# Patient Record
Sex: Female | Born: 1955
Health system: Southern US, Community
[De-identification: ages and names within clinical notes are randomized; demographics above are authoritative.]

## PROBLEM LIST (undated history)

## (undated) DIAGNOSIS — G4700 Insomnia, unspecified: Secondary | ICD-10-CM

## (undated) DIAGNOSIS — R011 Cardiac murmur, unspecified: Secondary | ICD-10-CM

## (undated) HISTORY — DX: Cardiac murmur, unspecified: R01.1

## (undated) HISTORY — DX: Insomnia, unspecified: G47.00

## (undated) HISTORY — PX: POLYPECTOMY: SHX149

---

## 1979-10-09 HISTORY — PX: OVARIAN CYST REMOVAL: SHX89

## 2006-08-07 ENCOUNTER — Ambulatory Visit: Payer: Self-pay | Admitting: Family Medicine

## 2007-05-12 ENCOUNTER — Ambulatory Visit: Payer: Self-pay | Admitting: Family Medicine

## 2007-05-12 DIAGNOSIS — M545 Low back pain, unspecified: Secondary | ICD-10-CM | POA: Insufficient documentation

## 2007-05-12 DIAGNOSIS — G4700 Insomnia, unspecified: Secondary | ICD-10-CM

## 2007-05-12 DIAGNOSIS — M899 Disorder of bone, unspecified: Secondary | ICD-10-CM

## 2007-05-12 DIAGNOSIS — M949 Disorder of cartilage, unspecified: Secondary | ICD-10-CM

## 2007-08-13 ENCOUNTER — Telehealth: Payer: Self-pay | Admitting: Family Medicine

## 2007-09-30 ENCOUNTER — Ambulatory Visit: Payer: Self-pay | Admitting: Family Medicine

## 2007-10-06 ENCOUNTER — Ambulatory Visit: Payer: Self-pay | Admitting: Family Medicine

## 2007-10-07 LAB — CONVERTED CEMR LAB
ALT: 21 units/L (ref 0–35)
Alkaline Phosphatase: 85 units/L (ref 39–117)
BUN: 14 mg/dL (ref 6–23)
Basophils Relative: 3.4 % — ABNORMAL HIGH (ref 0.0–1.0)
Bilirubin, Direct: 0.2 mg/dL (ref 0.0–0.3)
CA 125: 6.3 units/mL (ref 0.0–30.2)
CO2: 31 meq/L (ref 19–32)
Creatinine, Ser: 0.6 mg/dL (ref 0.4–1.2)
Eosinophils Relative: 2.2 % (ref 0.0–5.0)
Glucose, Bld: 82 mg/dL (ref 70–99)
HCT: 43.5 % (ref 36.0–46.0)
Hemoglobin: 14.8 g/dL (ref 12.0–15.0)
LDL Cholesterol: 105 mg/dL — ABNORMAL HIGH (ref 0–99)
Lymphocytes Relative: 27.9 % (ref 12.0–46.0)
Monocytes Absolute: 0.3 10*3/uL (ref 0.2–0.7)
Monocytes Relative: 5.7 % (ref 3.0–11.0)
Potassium: 3.9 meq/L (ref 3.5–5.1)
RDW: 11.3 % — ABNORMAL LOW (ref 11.5–14.6)
Total Bilirubin: 0.7 mg/dL (ref 0.3–1.2)
Total Protein: 7.3 g/dL (ref 6.0–8.3)
VLDL: 17 mg/dL (ref 0–40)
WBC: 5.4 10*3/uL (ref 4.5–10.5)

## 2007-11-27 ENCOUNTER — Ambulatory Visit: Payer: Self-pay | Admitting: Family Medicine

## 2007-12-01 LAB — CONVERTED CEMR LAB: Calcium: 9.7 mg/dL (ref 8.4–10.5)

## 2007-12-03 ENCOUNTER — Ambulatory Visit: Payer: Self-pay | Admitting: Family Medicine

## 2008-04-21 ENCOUNTER — Telehealth: Payer: Self-pay | Admitting: Family Medicine

## 2008-10-18 ENCOUNTER — Telehealth: Payer: Self-pay | Admitting: Family Medicine

## 2008-12-01 ENCOUNTER — Ambulatory Visit: Payer: Self-pay | Admitting: Family Medicine

## 2008-12-03 ENCOUNTER — Encounter: Payer: Self-pay | Admitting: Family Medicine

## 2008-12-03 LAB — CONVERTED CEMR LAB
ALT: 18 units/L (ref 0–35)
Alkaline Phosphatase: 73 units/L (ref 39–117)
Bilirubin Urine: NEGATIVE
Bilirubin, Direct: 0.2 mg/dL (ref 0.0–0.3)
CO2: 30 meq/L (ref 19–32)
GFR calc Af Amer: 112 mL/min
Glucose, Bld: 87 mg/dL (ref 70–99)
HDL: 54 mg/dL (ref >39.0)
Hemoglobin, Urine: NEGATIVE
Leukocytes, UA: NEGATIVE
Lymphocytes Relative: 33.4 % (ref 12.0–46.0)
Monocytes Relative: 10 % (ref 3.0–12.0)
Neutrophils Relative %: 53.8 % (ref 43.0–77.0)
Nitrite: NEGATIVE
Platelets: 174 10*3/uL (ref 150–400)
Potassium: 4 meq/L (ref 3.5–5.1)
RDW: 11.2 % — ABNORMAL LOW (ref 11.5–14.6)
Sodium: 142 meq/L (ref 135–145)
Total CHOL/HDL Ratio: 3.1
Total Protein: 6.7 g/dL (ref 6.0–8.3)
VLDL: 11 mg/dL (ref 0–40)
Vit D, 25-Hydroxy: 37 ng/mL (ref 30–89)

## 2008-12-09 ENCOUNTER — Ambulatory Visit: Payer: Self-pay | Admitting: Family Medicine

## 2008-12-09 DIAGNOSIS — E559 Vitamin D deficiency, unspecified: Secondary | ICD-10-CM | POA: Insufficient documentation

## 2009-04-08 ENCOUNTER — Telehealth: Payer: Self-pay | Admitting: Family Medicine

## 2009-05-02 ENCOUNTER — Ambulatory Visit: Payer: Self-pay | Admitting: Internal Medicine

## 2009-05-16 ENCOUNTER — Ambulatory Visit: Payer: Self-pay | Admitting: Internal Medicine

## 2009-05-16 ENCOUNTER — Encounter: Payer: Self-pay | Admitting: Internal Medicine

## 2009-05-17 ENCOUNTER — Encounter: Payer: Self-pay | Admitting: Internal Medicine

## 2009-05-26 ENCOUNTER — Ambulatory Visit: Payer: Self-pay | Admitting: Family Medicine

## 2009-07-26 ENCOUNTER — Ambulatory Visit: Payer: Self-pay | Admitting: Family Medicine

## 2009-07-26 DIAGNOSIS — D179 Benign lipomatous neoplasm, unspecified: Secondary | ICD-10-CM | POA: Insufficient documentation

## 2009-09-19 ENCOUNTER — Ambulatory Visit: Payer: Self-pay | Admitting: Family Medicine

## 2009-09-19 DIAGNOSIS — J0110 Acute frontal sinusitis, unspecified: Secondary | ICD-10-CM | POA: Insufficient documentation

## 2010-03-23 ENCOUNTER — Telehealth (INDEPENDENT_AMBULATORY_CARE_PROVIDER_SITE_OTHER): Payer: Self-pay

## 2010-05-04 ENCOUNTER — Ambulatory Visit: Payer: Self-pay | Admitting: Family Medicine

## 2010-05-04 LAB — CONVERTED CEMR LAB
Bilirubin Urine: NEGATIVE
Ketones, urine, test strip: NEGATIVE
Specific Gravity, Urine: 1.015
Urobilinogen, UA: 0.2

## 2010-05-10 LAB — CONVERTED CEMR LAB
BUN: 19 mg/dL (ref 6–23)
Bilirubin, Direct: 0.1 mg/dL (ref 0.0–0.3)
CO2: 29 meq/L (ref 19–32)
Chloride: 99 meq/L (ref 96–112)
Cholesterol: 164 mg/dL (ref 0–200)
Creatinine, Ser: 0.6 mg/dL (ref 0.4–1.2)
Eosinophils Absolute: 0.1 10*3/uL (ref 0.0–0.7)
Glucose, Bld: 82 mg/dL (ref 70–99)
LDL Cholesterol: 91 mg/dL (ref 0–99)
MCHC: 34.3 g/dL (ref 30.0–36.0)
MCV: 87.3 fL (ref 78.0–100.0)
Monocytes Absolute: 0.5 10*3/uL (ref 0.1–1.0)
Neutrophils Relative %: 60.8 % (ref 43.0–77.0)
Platelets: 197 10*3/uL (ref 150.0–400.0)
RDW: 12.4 % (ref 11.5–14.6)
TSH: 1.77 microintl units/mL (ref 0.35–5.50)
Total Bilirubin: 0.6 mg/dL (ref 0.3–1.2)
Triglycerides: 120 mg/dL (ref 0.0–149.0)
Vit D, 25-Hydroxy: 79 ng/mL (ref 30–89)

## 2010-05-30 ENCOUNTER — Ambulatory Visit: Payer: Self-pay | Admitting: Family Medicine

## 2010-08-18 ENCOUNTER — Ambulatory Visit: Payer: Self-pay | Admitting: Family Medicine

## 2010-08-18 DIAGNOSIS — F329 Major depressive disorder, single episode, unspecified: Secondary | ICD-10-CM

## 2010-08-18 DIAGNOSIS — F3289 Other specified depressive episodes: Secondary | ICD-10-CM | POA: Insufficient documentation

## 2010-08-21 ENCOUNTER — Telehealth: Payer: Self-pay | Admitting: Family Medicine

## 2010-11-07 NOTE — Assessment & Plan Note (Signed)
Summary: fu on med/njr   Vital Signs:  Patient profile:   55 year old female Weight:      132 pounds O2 Sat:      99 % Temp:     98.7 degrees F Pulse rate:   69 / minute BP sitting:   120 / 80  (left arm)  Vitals Entered By: Pura Spice, RN (August 18, 2010 9:54 AM) CC: discuss meds    History of Present Illness: Here to discuss the stress she has been going through for the past 6 months. She and her husband have been dealing with a lot of marital problems, and she thinks the marriage may not survive. They are both seeing separate therapists, but are not making much progress.  In addition to this, she is working full time and going to graduate school. She is always tense, fearful, tearful, and loses her  patience easily. She sleeps well thanks to the Temazepam she is taking. Trying to eat well and exercise.   Allergies (verified): No Known Drug Allergies  Past History:  Past Medical History: Reviewed history from 05/30/2010 and no changes required. osteoporosis (last DEXA on 2010) Insomnia heart murmur as a child, resolved sees Dr. Esperanza Richters for GYN exams  Review of Systems  The patient denies anorexia, fever, weight loss, weight gain, vision loss, decreased hearing, hoarseness, chest pain, syncope, dyspnea on exertion, peripheral edema, prolonged cough, headaches, hemoptysis, abdominal pain, melena, hematochezia, severe indigestion/heartburn, hematuria, incontinence, genital sores, muscle weakness, suspicious skin lesions, transient blindness, difficulty walking, depression, unusual weight change, abnormal bleeding, enlarged lymph nodes, angioedema, breast masses, and testicular masses.    Physical Exam  General:  Well-developed,well-nourished,in no acute distress; alert,appropriate and cooperative throughout examination Psych:  Oriented X3, memory intact for recent and remote, normally interactive, good eye contact, tearful, and slightly anxious.     Impression &  Recommendations:  Problem # 1:  DEPRESSION (ICD-311)  Her updated medication list for this problem includes:    Celexa 20 Mg Tabs (Citalopram hydrobromide) ..... Once daily  Complete Medication List: 1)  Restoril 15 Mg Caps (Temazepam) .... At bedtime as needed sleep 2)  Celexa 20 Mg Tabs (Citalopram hydrobromide) .... Once daily 3)  Evista 60 Mg Tabs (Raloxifene hcl) .... Once daily  Patient Instructions: 1)  We spent 30 minutes discussing this and its treatment. Try Celexa.  2)  Please schedule a follow-up appointment in 1 month.  Prescriptions: CELEXA 20 MG TABS (CITALOPRAM HYDROBROMIDE) once daily  #30 x 11   Entered and Authorized by:   Nelwyn Salisbury MD   Signed by:   Nelwyn Salisbury MD on 08/18/2010   Method used:   [facility]  [place] [address]       [address]       [place], [place]  [address]       Ph: [phone] or [phone]       Fax: [phone]   RxID:   [id number]    Orders Added: 1)  Est. Patient Level IV [03474]

## 2010-11-07 NOTE — Progress Notes (Signed)
Summary: Pt req diff anti-anxiety med. Pls call in to [facility]  Phone Note Call from Patient Call back at [phone] work or [phone] cell   Caller: Patient Summary of Call: Pt called and said that the anti-anxiety med in not working. Pt having nausea and drowsiness. Pt [facility] [place]  Initial call taken by: Lucy Antigua,  August 21, 2010 11:21 AM  Follow-up for Phone Call        okay, stop this. try Xanax 0.25 mg two times a day as needed . Call in #60 with no rf. If this is too strong, she could take 1/2 tab two times a day . Follow-up by: Nelwyn Salisbury MD,  August 21, 2010 1:44 PM  Additional Follow-up for Phone Call Additional follow up Details #1::        mess left to return call  work number numerous busy signals Additional Follow-up by: Pura Spice, RN,  August 21, 2010 2:01 PM    Additional Follow-up for Phone Call Additional follow up Details #2::    spoke with pt med called in pt aware. [facility] spoke to Ray rx given  Follow-up by: Pura Spice, RN,  August 21, 2010 5:08 PM  New/Updated Medications: ALPRAZOLAM 0.25 MG TABS (ALPRAZOLAM) 1 by mouth two times a day as needed Prescriptions: ALPRAZOLAM 0.25 MG TABS (ALPRAZOLAM) 1 by mouth two times a day as needed  #60 x 0   Entered by:   Pura Spice, RN   Authorized by:   Nelwyn Salisbury MD   Signed by:   Pura Spice, RN on 08/21/2010   Method used:   Telephoned to ...       [facility]  [place] [address] (retail)       [address]       [place], [place]  [address]       Ph: [phone] or [phone]       Fax: [phone]   RxID:   [id number]

## 2010-11-07 NOTE — Progress Notes (Signed)
Summary: Pt req to have Vit D lab added to cpx labs  Phone Note Call from Patient Call back at Home Phone [phone]   Caller: Patient Summary of Call: Pt coming in for cpx in Aug 2011 and having cpx labs in July. Pt says she needs to have Vit D lvl checked during cpx labs.       Initial call taken by: Lucy Antigua,  March 23, 2010 10:20 AM  Follow-up for Phone Call        yes please do so Follow-up by: Nelwyn Salisbury MD,  March 23, 2010 10:28 AM

## 2010-11-07 NOTE — Assessment & Plan Note (Signed)
Summary: CPX/CJR---PT [facility] // RS   Vital Signs:  Patient profile:   55 year old female Weight:      133 pounds BMI:     23.27 BP sitting:   88 / 60  (left arm) Cuff size:   regular  Vitals Entered By: Raechel Ache, RN (May 30, 2010 2:55 PM) CC: CPX Comments , labs done. Sees gyn.   History of Present Illness: 55 yr old female for a cpx. She feels well and has no concerns.   Allergies (verified): No Known Drug Allergies  Past History:  Past Medical History: osteoporosis (last DEXA on 2010) Insomnia heart murmur as a child, resolved sees Dr. Esperanza Richters for GYN exams  Past Surgical History: Colonoscopy 05-16-09 per Dr. Marina Goodell, several benign polyps, repeat in 5 yrs ovarian cyst removal  Family History: Reviewed history from 05/12/2007 and no changes required. Family History Hypertension Fam hx Stroke  Social History: Reviewed history from 05/12/2007 and no changes required. Never Smoked Married Alcohol use-yes Drug use-no Regular exercise-yes  Review of Systems  The patient denies anorexia, fever, weight loss, weight gain, vision loss, decreased hearing, hoarseness, chest pain, syncope, dyspnea on exertion, peripheral edema, prolonged cough, headaches, hemoptysis, abdominal pain, melena, hematochezia, severe indigestion/heartburn, hematuria, incontinence, genital sores, muscle weakness, suspicious skin lesions, transient blindness, difficulty walking, depression, unusual weight change, abnormal bleeding, enlarged lymph nodes, angioedema, breast masses, and testicular masses.    Physical Exam  General:  Well-developed,well-nourished,in no acute distress; alert,appropriate and cooperative throughout examination Head:  Normocephalic and atraumatic without obvious abnormalities. No apparent alopecia or balding. Eyes:  No corneal or conjunctival inflammation noted. EOMI. Perrla. Funduscopic exam benign, without hemorrhages, exudates or papilledema. Vision grossly  normal. Ears:  External ear exam shows no significant lesions or deformities.  Otoscopic examination reveals clear canals, tympanic membranes are intact bilaterally without bulging, retraction, inflammation or discharge. Hearing is grossly normal bilaterally. Nose:  External nasal examination shows no deformity or inflammation. Nasal mucosa are pink and moist without lesions or exudates. Mouth:  Oral mucosa and oropharynx without lesions or exudates.  Teeth in good repair. Neck:  No deformities, masses, or tenderness noted. Chest Wall:  No deformities, masses, or tenderness noted. Lungs:  Normal respiratory effort, chest expands symmetrically. Lungs are clear to auscultation, no crackles or wheezes. Heart:  Normal rate and regular rhythm. S1 and S2 normal without gallop, murmur, click, rub or other extra sounds. EKG normal Abdomen:  Bowel sounds positive,abdomen soft and non-tender without masses, organomegaly or hernias noted. Msk:  No deformity or scoliosis noted of thoracic or lumbar spine.   Pulses:  R and L carotid,radial,femoral,dorsalis pedis and posterior tibial pulses are full and equal bilaterally Extremities:  No clubbing, cyanosis, edema, or deformity noted with normal full range of motion of all joints.   Neurologic:  No cranial nerve deficits noted. Station and gait are normal. Plantar reflexes are down-going bilaterally. DTRs are symmetrical throughout. Sensory, motor and coordinative functions appear intact. Skin:  Intact without suspicious lesions or rashes Cervical Nodes:  No lymphadenopathy noted Axillary Nodes:  No palpable lymphadenopathy Inguinal Nodes:  No significant adenopathy Psych:  Cognition and judgment appear intact. Alert and cooperative with normal attention span and concentration. No apparent delusions, illusions, hallucinations   Impression & Recommendations:  Problem # 1:  WELL ADULT EXAM (ICD-V70.0)  Orders: EKG w/ Interpretation (93000)  Complete  Medication List: 1)  Actonel 35 Mg Tabs (Risedronate sodium) .... One tablet every week 2)  Restoril 15  Mg Caps (Temazepam) .... At bedtime as needed sleep  Patient Instructions: 1)  Please schedule a follow-up appointment in 1 year.  Prescriptions: RESTORIL 15 MG  CAPS (TEMAZEPAM) at bedtime as needed sleep  #30 x 5   Entered and Authorized by:   Nelwyn Salisbury MD   Signed by:   Nelwyn Salisbury MD on 05/30/2010   Method used:   Print then Give to Patient   RxID:   [id number]

## 2011-02-23 NOTE — Assessment & Plan Note (Signed)
[facility] OFFICE NOTE   Latasha Marshall, Latasha Marshall                    MRN:          [id number]  DATE:08/07/2006                            DOB:          July 27, 1956    This is a 55 year old woman here to establish here with our practice.  She  is primarily to have her prescription for Actonel refilled, but also  requesting something to help with sleep.  She has tried some over-the-  counter agents.  They do not really work, and she feels hung over the next  morning with them.  She probably would use something for sleep 2 to 3 nights  per week only.  Of note, she was diagnosed with osteopenia about 2 years ago  by a DEXA scan.  She was started on Actonel as well as calcium and vitamin D  therapy at that time.  She then had a 2nd DEXA scan in July of this year,  which showed no substantial changes.  Of note, she moved back to [place]  from [place] in August of this year, having grown up in the [place]  area.   PAST MEDICAL HISTORY:  1. Includes a heart murmur as a child, which resolved.  2. She had a colonoscopy in 2006 when a couple of benign polyps were      removed.  3. She had chickenpox as a child.  4. She had surgery to remove a ovarian cyst in 1981.  5. She has had 2 vaginal deliveries.  6. Her last Pap smear was in 2006, and was normal.  7. She did have a normal mammogram in July of this year.   IMMUNIZATIONS:  She had a tetanus booster in June of this year.   ALLERGIES:  NONE.   CURRENT MEDICATIONS:  Actonel 35 mg once weekly.   HABITS:  She does drink some alcohol.  She does not use tobacco.   SOCIAL HISTORY:  She is married.  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   FAMILY HISTORY:  Remarkable for stroke and hypertension.   OBJECTIVE:  VITAL SIGNS:  Height 5 foot 3 inches.  Weight 131.  BP 114/77.  Pulse 66 and regular.  GENERAL:  She appears to be healthy.  Did  not do a detailed examination today.   ASSESSMENT AND PLAN:  1. Osteopenia.  I refilled Actonel for the coming year, and I encouraged      her to continue with      Calcium and vitamin D supplementation.  2. Insomnia.  We will try temazepam 15 mg q. h.s. as needed, number 30      with 5 refills.    ______________________________  Tera Mater Clent Ridges, MD    SAF/MedQ  DD: 08/08/2006  DT: 08/08/2006  Job #: [id number]

## 2011-05-21 ENCOUNTER — Ambulatory Visit: Payer: Self-pay

## 2011-05-21 DIAGNOSIS — Z0000 Encounter for general adult medical examination without abnormal findings: Secondary | ICD-10-CM

## 2011-05-21 LAB — CBC WITH DIFFERENTIAL/PLATELET
Basophils Relative: 0.5 % (ref 0.0–3.0)
Eosinophils Absolute: 0.1 10*3/uL (ref 0.0–0.7)
Eosinophils Relative: 1.7 % (ref 0.0–5.0)
Hemoglobin: 14.1 g/dL (ref 12.0–15.0)
Lymphocytes Relative: 29.6 % (ref 12.0–46.0)
MCHC: 33.6 g/dL (ref 30.0–36.0)
MCV: 88.5 fl (ref 78.0–100.0)
Monocytes Absolute: 0.4 10*3/uL (ref 0.1–1.0)
Neutro Abs: 2.9 10*3/uL (ref 1.4–7.7)
RBC: 4.74 Mil/uL (ref 3.87–5.11)

## 2011-05-21 LAB — HEPATIC FUNCTION PANEL
Alkaline Phosphatase: 71 U/L (ref 39–117)
Bilirubin, Direct: 0.1 mg/dL (ref 0.0–0.3)
Total Bilirubin: 1 mg/dL (ref 0.3–1.2)
Total Protein: 7 g/dL (ref 6.0–8.3)

## 2011-05-21 LAB — URINALYSIS, ROUTINE W REFLEX MICROSCOPIC
Hgb urine dipstick: NEGATIVE
Nitrite: NEGATIVE
Specific Gravity, Urine: 1.01 (ref 1.000–1.030)
Total Protein, Urine: NEGATIVE
Urine Glucose: NEGATIVE
pH: 7.5 (ref 5.0–8.0)

## 2011-05-21 LAB — BASIC METABOLIC PANEL
BUN: 15 mg/dL (ref 6–23)
Chloride: 101 mEq/L (ref 96–112)
Creatinine, Ser: 0.8 mg/dL (ref 0.4–1.2)
Glucose, Bld: 88 mg/dL (ref 70–99)

## 2011-05-21 LAB — LIPID PANEL
Cholesterol: 170 mg/dL (ref 0–200)
LDL Cholesterol: 85 mg/dL (ref 0–99)

## 2011-05-24 ENCOUNTER — Telehealth: Payer: Self-pay | Admitting: Family Medicine

## 2011-05-24 ENCOUNTER — Other Ambulatory Visit: Payer: Self-pay

## 2011-05-24 NOTE — Telephone Encounter (Signed)
Left voice message.

## 2011-05-24 NOTE — Telephone Encounter (Signed)
Message copied by Baldemar Friday on Thu May 24, 2011  1:08 PM ------      Message from: Gershon Crane A      Created: Thu May 24, 2011  5:52 AM       normal

## 2011-05-30 ENCOUNTER — Encounter: Payer: Self-pay | Admitting: Family Medicine

## 2011-05-31 ENCOUNTER — Encounter: Payer: Self-pay | Admitting: Family Medicine

## 2011-05-31 ENCOUNTER — Ambulatory Visit (INDEPENDENT_AMBULATORY_CARE_PROVIDER_SITE_OTHER): Payer: BC Managed Care – PPO | Admitting: Family Medicine

## 2011-05-31 VITALS — BP 106/64 | HR 60 | Temp 98.6°F | Ht 63.5 in | Wt 134.0 lb

## 2011-05-31 DIAGNOSIS — Z2911 Encounter for prophylactic immunotherapy for respiratory syncytial virus (RSV): Secondary | ICD-10-CM

## 2011-05-31 DIAGNOSIS — M810 Age-related osteoporosis without current pathological fracture: Secondary | ICD-10-CM

## 2011-05-31 DIAGNOSIS — Z0000 Encounter for general adult medical examination without abnormal findings: Secondary | ICD-10-CM

## 2011-05-31 MED ORDER — TEMAZEPAM 15 MG PO CAPS: 15.0000 mg | ORAL_CAPSULE | Freq: Every evening | ORAL | Status: DC | PRN

## 2011-05-31 NOTE — Progress Notes (Signed)
  Subjective:    Patient ID: Latasha Marshall, female    DOB: [birth year], 55 y.o.   MRN: [id number]  HPI 55 yr old female for a cpx. She feels well and has no complaints. She would like a shingles shot. She works out regularly at [employer]    Review of Systems  Constitutional: Negative.   HENT: Negative.   Eyes: Negative.   Respiratory: Negative.   Cardiovascular: Negative.   Gastrointestinal: Negative.   Genitourinary: Negative for dysuria, urgency, frequency, hematuria, flank pain, decreased urine volume, enuresis, difficulty urinating, pelvic pain and dyspareunia.  Musculoskeletal: Negative.   Skin: Negative.   Neurological: Negative.   Hematological: Negative.   Psychiatric/Behavioral: Negative.        Objective:   Physical Exam  Constitutional: She is oriented to person, place, and time. She appears well-developed and well-nourished. No distress.  HENT:  Head: Normocephalic and atraumatic.  Right Ear: External ear normal.  Left Ear: External ear normal.  Nose: Nose normal.  Mouth/Throat: Oropharynx is clear and moist. No oropharyngeal exudate.  Eyes: Conjunctivae and EOM are normal. Pupils are equal, round, and reactive to light. No scleral icterus.  Neck: Normal range of motion. Neck supple. No JVD present. No thyromegaly present.  Cardiovascular: Normal rate, regular rhythm, normal heart sounds and intact distal pulses.  Exam reveals no gallop and no friction rub.   No murmur heard.      EKG normal   Pulmonary/Chest: Effort normal and breath sounds normal. No respiratory distress. She has no wheezes. She has no rales. She exhibits no tenderness.  Abdominal: Soft. Bowel sounds are normal. She exhibits no distension and no mass. There is no tenderness. There is no rebound and no guarding.  Musculoskeletal: Normal range of motion. She exhibits no edema and no tenderness.  Lymphadenopathy:    She has no cervical adenopathy.  Neurological: She is alert and oriented to  person, place, and time. She has normal reflexes. No cranial nerve deficit. She exhibits normal muscle tone. Coordination normal.  Skin: Skin is warm and dry. No rash noted. No erythema.  Psychiatric: She has a normal mood and affect. Her behavior is normal. Judgment and thought content normal.          Assessment & Plan:  Well exam. Check a vitamin D level.

## 2011-06-06 ENCOUNTER — Telehealth: Payer: Self-pay | Admitting: Family Medicine

## 2011-06-06 NOTE — Telephone Encounter (Signed)
Left voice message with results.

## 2011-06-06 NOTE — Telephone Encounter (Signed)
Message copied by Baldemar Friday on Wed Jun 06, 2011 10:51 AM ------      Message from: Gershon Crane A      Created: Mon Jun 04, 2011 10:28 AM       normal

## 2012-03-05 ENCOUNTER — Telehealth: Payer: Self-pay | Admitting: Family Medicine

## 2012-03-05 NOTE — Telephone Encounter (Signed)
Call in #30 with 5 rf 

## 2012-03-05 NOTE — Telephone Encounter (Signed)
Refill request for Temazepam 15 mg take 1 po qhs prn and last here on 05/31/11.

## 2012-03-07 MED ORDER — TEMAZEPAM 15 MG PO CAPS: 15.0000 mg | ORAL_CAPSULE | Freq: Every evening | ORAL | Status: DC | PRN

## 2012-03-07 NOTE — Telephone Encounter (Signed)
I called in script 

## 2012-08-11 ENCOUNTER — Telehealth: Payer: Self-pay | Admitting: Family Medicine

## 2012-08-11 DIAGNOSIS — Z0000 Encounter for general adult medical examination without abnormal findings: Secondary | ICD-10-CM

## 2012-08-11 NOTE — Telephone Encounter (Signed)
done

## 2012-08-11 NOTE — Telephone Encounter (Signed)
Patient called to schedule cpx and labs and is requesting to have a Vit D added to labs for 08/18/12. Please assist.

## 2012-08-18 ENCOUNTER — Other Ambulatory Visit (INDEPENDENT_AMBULATORY_CARE_PROVIDER_SITE_OTHER): Payer: BC Managed Care – PPO

## 2012-08-18 DIAGNOSIS — Z0000 Encounter for general adult medical examination without abnormal findings: Secondary | ICD-10-CM

## 2012-08-18 LAB — CBC WITH DIFFERENTIAL/PLATELET
Basophils Relative: 0.3 % (ref 0.0–3.0)
Eosinophils Absolute: 0 10*3/uL (ref 0.0–0.7)
HCT: 41.6 % (ref 36.0–46.0)
Lymphs Abs: 1.4 10*3/uL (ref 0.7–4.0)
MCHC: 33 g/dL (ref 30.0–36.0)
MCV: 88.8 fl (ref 78.0–100.0)
Monocytes Absolute: 0.4 10*3/uL (ref 0.1–1.0)
Neutrophils Relative %: 70 % (ref 43.0–77.0)
RBC: 4.69 Mil/uL (ref 3.87–5.11)

## 2012-08-18 LAB — BASIC METABOLIC PANEL
BUN: 12 mg/dL (ref 6–23)
CO2: 26 mEq/L (ref 19–32)
Chloride: 104 mEq/L (ref 96–112)
Creatinine, Ser: 0.7 mg/dL (ref 0.4–1.2)
Potassium: 4 mEq/L (ref 3.5–5.1)

## 2012-08-18 LAB — HEPATIC FUNCTION PANEL
ALT: 20 U/L (ref 0–35)
Albumin: 3.9 g/dL (ref 3.5–5.2)
Bilirubin, Direct: 0.1 mg/dL (ref 0.0–0.3)
Total Protein: 7 g/dL (ref 6.0–8.3)

## 2012-08-18 LAB — POCT URINALYSIS DIPSTICK
Ketones, UA: NEGATIVE
Leukocytes, UA: NEGATIVE
Nitrite, UA: NEGATIVE
Protein, UA: NEGATIVE
Urobilinogen, UA: 0.2
pH, UA: 7.5

## 2012-08-18 LAB — TSH: TSH: 1.82 u[IU]/mL (ref 0.35–5.50)

## 2012-08-18 LAB — LIPID PANEL: Cholesterol: 160 mg/dL (ref 0–200)

## 2012-08-19 LAB — VITAMIN D 25 HYDROXY (VIT D DEFICIENCY, FRACTURES): Vit D, 25-Hydroxy: 52 ng/mL (ref 30–89)

## 2012-08-20 NOTE — Progress Notes (Signed)
Quick Note:  I left voice message with results. ______ 

## 2012-08-29 ENCOUNTER — Encounter: Payer: Self-pay | Admitting: Family Medicine

## 2012-08-29 ENCOUNTER — Ambulatory Visit (INDEPENDENT_AMBULATORY_CARE_PROVIDER_SITE_OTHER): Payer: BC Managed Care – PPO | Admitting: Family Medicine

## 2012-08-29 VITALS — BP 110/68 | HR 72 | Temp 98.6°F | Ht 63.5 in | Wt 135.0 lb

## 2012-08-29 DIAGNOSIS — Z0000 Encounter for general adult medical examination without abnormal findings: Secondary | ICD-10-CM

## 2012-08-29 MED ORDER — TEMAZEPAM 15 MG PO CAPS: 15.0000 mg | ORAL_CAPSULE | Freq: Every evening | ORAL | Status: DC | PRN

## 2012-08-29 NOTE — Progress Notes (Signed)
  Subjective:    Patient ID: Latasha Marshall, female    DOB: September 01, 1956, 56 y.o.   MRN: [id number]  HPI 56 yr old female for a cpx. She feels great and has no complaints except for several weeks of mild pain in the left shoulder. No recent trauma. She works out with [employer] daily at home. She is going through a divorce which will be finalized next February.    Review of Systems  Constitutional: Negative.   HENT: Negative.   Eyes: Negative.   Respiratory: Negative.   Cardiovascular: Negative.   Gastrointestinal: Negative.   Genitourinary: Negative for dysuria, urgency, frequency, hematuria, flank pain, decreased urine volume, enuresis, difficulty urinating, pelvic pain and dyspareunia.  Musculoskeletal: Negative.   Skin: Negative.   Neurological: Negative.   Hematological: Negative.   Psychiatric/Behavioral: Negative.        Objective:   Physical Exam  Constitutional: She is oriented to person, place, and time. She appears well-developed and well-nourished. No distress.  HENT:  Head: Normocephalic and atraumatic.  Right Ear: External ear normal.  Left Ear: External ear normal.  Nose: Nose normal.  Mouth/Throat: Oropharynx is clear and moist. No oropharyngeal exudate.  Eyes: Conjunctivae normal and EOM are normal. Pupils are equal, round, and reactive to light. No scleral icterus.  Neck: Normal range of motion. Neck supple. No JVD present. No thyromegaly present.  Cardiovascular: Normal rate, regular rhythm, normal heart sounds and intact distal pulses.  Exam reveals no gallop and no friction rub.   No murmur heard.      EKG normal   Pulmonary/Chest: Effort normal and breath sounds normal. No respiratory distress. She has no wheezes. She has no rales. She exhibits no tenderness.  Abdominal: Soft. Bowel sounds are normal. She exhibits no distension and no mass. There is no tenderness. There is no rebound and no guarding.  Musculoskeletal: Normal range of motion. She  exhibits no edema and no tenderness.  Lymphadenopathy:    She has no cervical adenopathy.  Neurological: She is alert and oriented to person, place, and time. She has normal reflexes. No cranial nerve deficit. She exhibits normal muscle tone. Coordination normal.  Skin: Skin is warm and dry. No rash noted. No erythema.  Psychiatric: She has a normal mood and affect. Her behavior is normal. Judgment and thought content normal.          Assessment & Plan:  Well exam. I suggested she stop lifting weights for a few weeks to rest the shoulder.

## 2012-12-15 ENCOUNTER — Other Ambulatory Visit: Payer: Self-pay | Admitting: Obstetrics and Gynecology

## 2012-12-23 ENCOUNTER — Ambulatory Visit
Admission: RE | Admit: 2012-12-23 | Discharge: 2012-12-23 | Disposition: A | Discharge status: Home / Self Care | Payer: BC Managed Care – PPO | Source: Ambulatory Visit | Attending: Obstetrics and Gynecology | Admitting: Obstetrics and Gynecology

## 2012-12-23 DIAGNOSIS — R928 Other abnormal and inconclusive findings on diagnostic imaging of breast: Secondary | ICD-10-CM

## 2012-12-23 IMAGING — MG MM DIGITAL DIAGNOSTIC UNILAT*L*
2 series · 2 of 2 positions shown · non-contrast
Comparison: [DATE] [DATE], [DATE], [DATE] [DATE], [DATE], [DATE] [DATE], [DATE]

CLINICAL DATA: Called back from screening mammogram for possible
mass left breast

DIGITAL DIAGNOSTIC LEFT MAMMOGRAM [DATE] AND LEFT BREAST
ULTRASOUND:

[L CC]
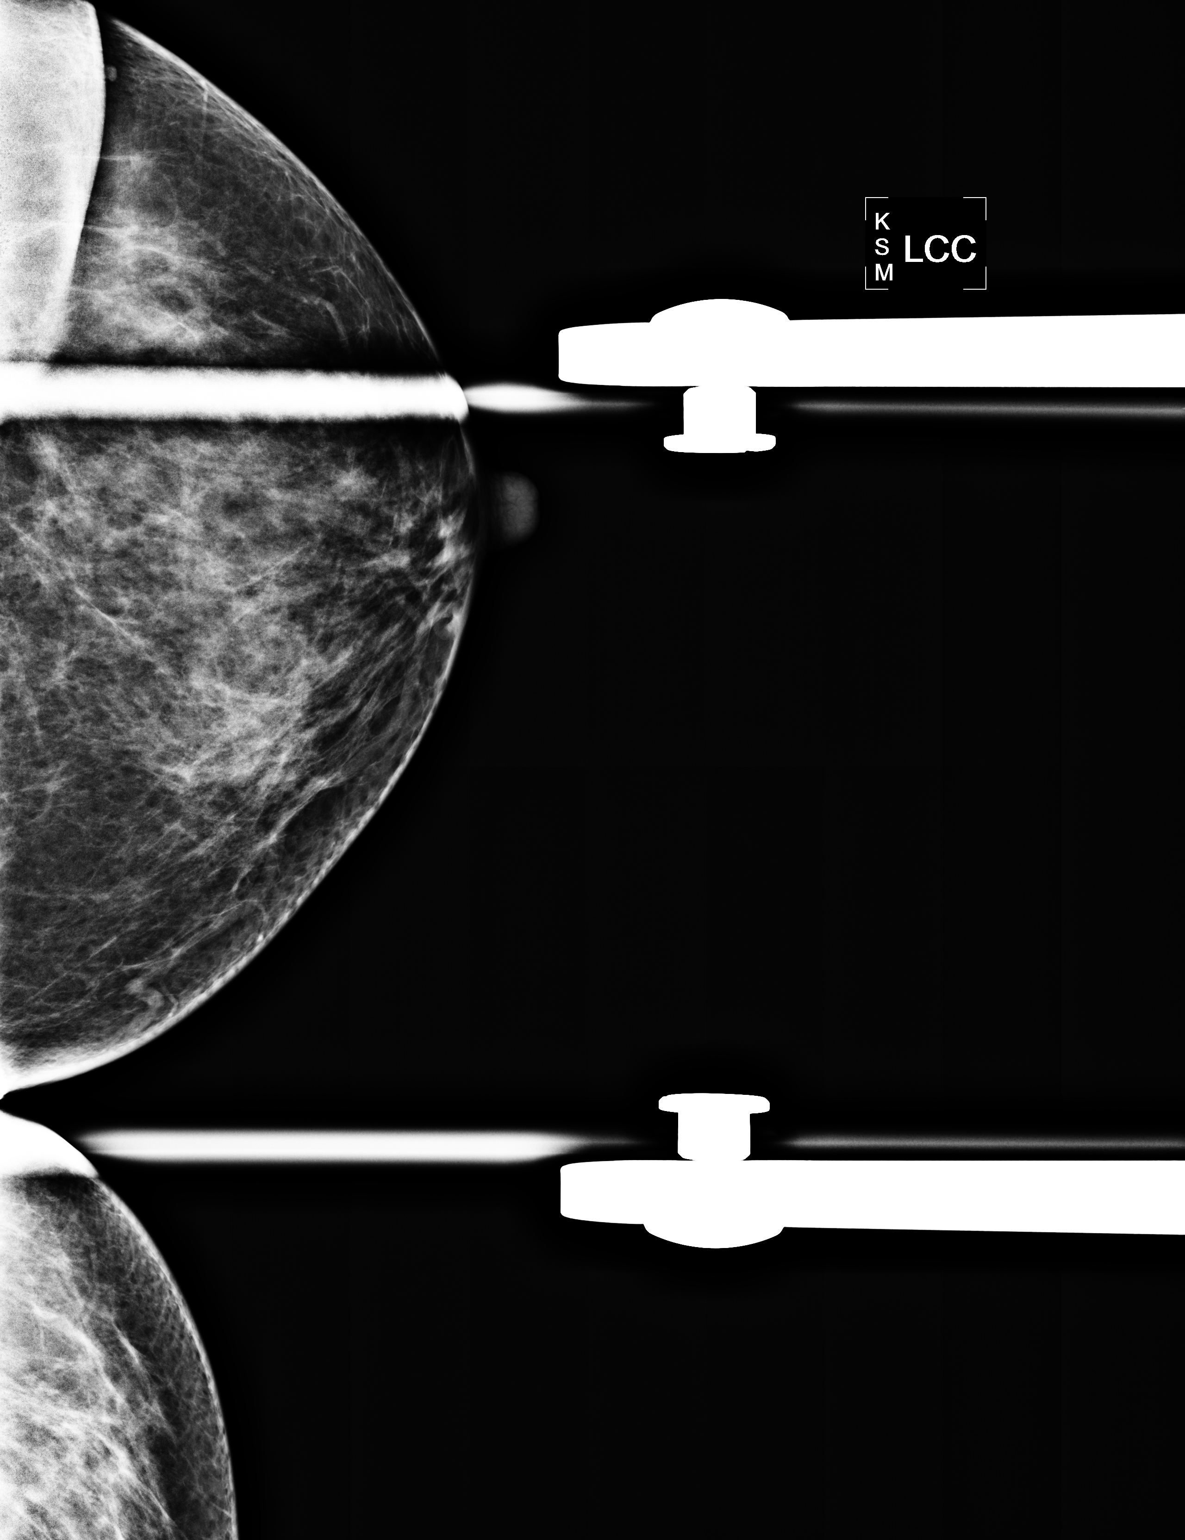

[L MLO]
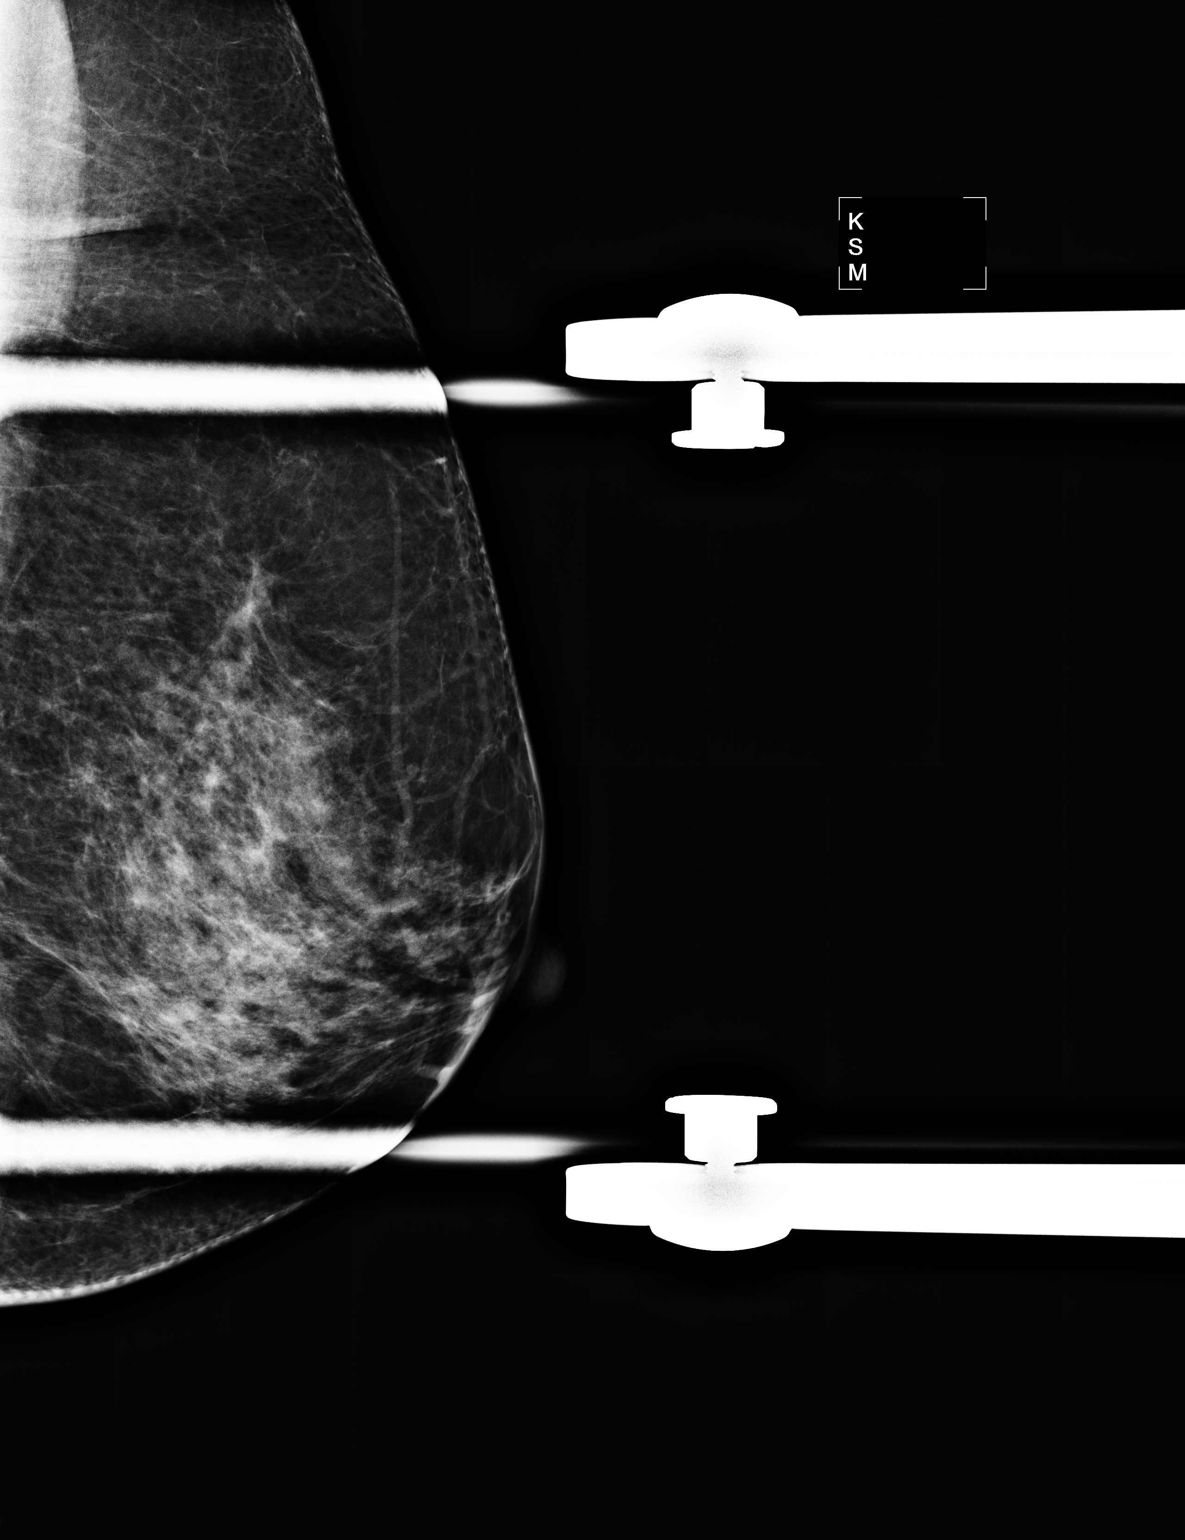

[2 of 2 positions shown; findings below may reference images not displayed]

FINDINGS: ACR Breast Density Category heterogeneously dense

Spot compression CC and MLO views of the left breast are submitted.
Previously questioned asymmetry does not persist on additional
views.

Ultrasound is performed, showing no focal abnormal discrete cystic
or solid lesion in the periareolar posterior left breast.
IMPRESSION: Negative.

RECOMMENDATION:
Routine screening mammogram back on schedule.

I have discussed the findings and recommendations with the patient.
Results were also provided in writing at the conclusion of the
visit.

BI-RADS CATEGORY 1:  Negative.

## 2012-12-24 ENCOUNTER — Other Ambulatory Visit: Payer: BC Managed Care – PPO

## 2013-05-26 ENCOUNTER — Other Ambulatory Visit: Payer: Self-pay | Admitting: Family Medicine

## 2013-05-28 NOTE — Telephone Encounter (Signed)
Call in #30 with 5 rf 

## 2014-01-19 ENCOUNTER — Telehealth: Payer: Self-pay | Admitting: Family Medicine

## 2014-01-19 ENCOUNTER — Other Ambulatory Visit: Payer: Self-pay | Admitting: Family Medicine

## 2014-01-19 NOTE — Telephone Encounter (Signed)
Pt last seen in 2013. Pt would like an abx to take to [place]. Pt will be going to [place] in June 2015. Pt has appt to discuss getting abx and ?immunization on 02-17-14. Pt will cancel appt if md  [facility]

## 2014-01-19 NOTE — Telephone Encounter (Signed)
We can talk about this at the [place]

## 2014-01-20 NOTE — Telephone Encounter (Signed)
Call in #30 only. She has an appt on 02-17-14

## 2014-01-20 NOTE — Telephone Encounter (Signed)
I left a voice message with below information. 

## 2014-01-20 NOTE — Telephone Encounter (Signed)
See the other note

## 2014-01-20 NOTE — Telephone Encounter (Signed)
lmom for pt to return my call. 

## 2014-01-21 NOTE — Telephone Encounter (Signed)
Pt will see md on 02-17-14

## 2014-02-17 ENCOUNTER — Ambulatory Visit: Payer: BC Managed Care – PPO | Admitting: Family Medicine

## 2014-02-18 ENCOUNTER — Ambulatory Visit (INDEPENDENT_AMBULATORY_CARE_PROVIDER_SITE_OTHER): Payer: BC Managed Care – PPO | Admitting: Family Medicine

## 2014-02-18 ENCOUNTER — Encounter: Payer: Self-pay | Admitting: Family Medicine

## 2014-02-18 VITALS — BP 126/73 | HR 72 | Temp 98.5°F | Ht 63.5 in | Wt 139.0 lb

## 2014-02-18 DIAGNOSIS — Z23 Encounter for immunization: Secondary | ICD-10-CM

## 2014-02-18 DIAGNOSIS — Z7189 Other specified counseling: Secondary | ICD-10-CM

## 2014-02-18 DIAGNOSIS — Z7185 Encounter for immunization safety counseling: Secondary | ICD-10-CM

## 2014-02-18 MED ORDER — CIPROFLOXACIN HCL 500 MG PO TABS
500.0000 mg | ORAL_TABLET | Freq: Two times a day (BID) | ORAL | Status: DC
Start: 2014-02-18 — End: 2015-05-30

## 2014-02-18 NOTE — Progress Notes (Signed)
Pre visit review using our clinic review tool, if applicable. No additional management support is needed unless otherwise documented below in the visit note. 

## 2014-02-18 NOTE — Addendum Note (Signed)
Addended by: Aggie Hacker A on: 02/18/2014 09:59 AM   Modules accepted: Orders

## 2014-02-18 NOTE — Progress Notes (Signed)
   Subjective:    Patient ID: Latasha Marshall, female    DOB: Nov 09, 1955, 58 y.o.   MRN: [id number]  HPI Here for advice about travelling to [place]. 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 She asks for an antibiotic to take in case of emergencies and if she needs any immunizations.    Review of Systems  Constitutional: Negative.   Respiratory: Negative.   Cardiovascular: Negative.        Objective:   Physical Exam  Constitutional: She appears well-developed and well-nourished.  Cardiovascular: Normal rate, regular rhythm, normal heart sounds and intact distal pulses.   Pulmonary/Chest: Effort normal and breath sounds normal.          Assessment & Plan:  She is given a supply of Cipro to take with her. Given a TDaP and shots for hepatitis A and B today.

## 2014-02-22 ENCOUNTER — Other Ambulatory Visit: Payer: Self-pay | Admitting: Family Medicine

## 2014-02-23 NOTE — Telephone Encounter (Signed)
Call in #30 with 5 rf 

## 2014-03-10 ENCOUNTER — Encounter: Payer: Self-pay | Admitting: Family Medicine

## 2014-04-21 ENCOUNTER — Encounter: Payer: Self-pay | Admitting: Internal Medicine

## 2014-05-03 ENCOUNTER — Ambulatory Visit (INDEPENDENT_AMBULATORY_CARE_PROVIDER_SITE_OTHER): Payer: BC Managed Care – PPO | Admitting: Family Medicine

## 2014-05-03 DIAGNOSIS — Z23 Encounter for immunization: Secondary | ICD-10-CM

## 2014-06-24 ENCOUNTER — Other Ambulatory Visit: Payer: Self-pay | Admitting: Obstetrics and Gynecology

## 2014-06-25 LAB — CYTOLOGY - PAP

## 2014-08-10 ENCOUNTER — Ambulatory Visit (INDEPENDENT_AMBULATORY_CARE_PROVIDER_SITE_OTHER): Payer: BC Managed Care – PPO | Admitting: Family Medicine

## 2014-08-10 DIAGNOSIS — Z23 Encounter for immunization: Secondary | ICD-10-CM

## 2014-12-06 ENCOUNTER — Other Ambulatory Visit: Payer: Self-pay | Admitting: Family Medicine

## 2014-12-07 NOTE — Telephone Encounter (Signed)
Call in #30 with 5 rf 

## 2015-03-01 ENCOUNTER — Telehealth: Payer: Self-pay

## 2015-03-01 NOTE — Telephone Encounter (Signed)
Left message for pt to call back concerning need for mammogram 

## 2015-03-29 ENCOUNTER — Other Ambulatory Visit (INDEPENDENT_AMBULATORY_CARE_PROVIDER_SITE_OTHER): Payer: BC Managed Care – PPO

## 2015-03-29 DIAGNOSIS — Z0000 Encounter for general adult medical examination without abnormal findings: Secondary | ICD-10-CM | POA: Diagnosis not present

## 2015-03-29 LAB — LIPID PANEL
Cholesterol: 139 mg/dL (ref 0–200)
HDL: 53.7 mg/dL (ref >39.00)
LDL Cholesterol: 62 mg/dL (ref 0–99)
NONHDL: 85.3
TRIGLYCERIDES: 119 mg/dL (ref 0.0–149.0)
Total CHOL/HDL Ratio: 3
VLDL: 23.8 mg/dL (ref 0.0–40.0)

## 2015-03-29 LAB — CBC WITH DIFFERENTIAL/PLATELET
Basophils Absolute: 0 10*3/uL (ref 0.0–0.1)
Basophils Relative: 0.7 % (ref 0.0–3.0)
EOS ABS: 0.1 10*3/uL (ref 0.0–0.7)
EOS PCT: 2.8 % (ref 0.0–5.0)
HCT: 39.2 % (ref 36.0–46.0)
Hemoglobin: 13 g/dL (ref 12.0–15.0)
Lymphocytes Relative: 36.3 % (ref 12.0–46.0)
Lymphs Abs: 1.6 10*3/uL (ref 0.7–4.0)
MCHC: 33.2 g/dL (ref 30.0–36.0)
MCV: 85.4 fl (ref 78.0–100.0)
Monocytes Absolute: 0.5 10*3/uL (ref 0.1–1.0)
Monocytes Relative: 10.1 % (ref 3.0–12.0)
Neutro Abs: 2.2 10*3/uL (ref 1.4–7.7)
Neutrophils Relative %: 50.1 % (ref 43.0–77.0)
PLATELETS: 221 10*3/uL (ref 150.0–400.0)
RBC: 4.59 Mil/uL (ref 3.87–5.11)
RDW: 13.9 % (ref 11.5–15.5)
WBC: 4.5 10*3/uL (ref 4.0–10.5)

## 2015-03-29 LAB — POCT URINALYSIS DIPSTICK
BILIRUBIN UA: NEGATIVE
Glucose, UA: NEGATIVE
Ketones, UA: NEGATIVE
LEUKOCYTES UA: NEGATIVE
Nitrite, UA: NEGATIVE
PH UA: 7
PROTEIN UA: NEGATIVE
RBC UA: NEGATIVE
Spec Grav, UA: 1.015
Urobilinogen, UA: 0.2

## 2015-03-29 LAB — COMPREHENSIVE METABOLIC PANEL
ALBUMIN: 3.8 g/dL (ref 3.5–5.2)
ALT: 17 U/L (ref 0–35)
AST: 22 U/L (ref 0–37)
Alkaline Phosphatase: 69 U/L (ref 39–117)
BUN: 13 mg/dL (ref 6–23)
CALCIUM: 9 mg/dL (ref 8.4–10.5)
CHLORIDE: 105 meq/L (ref 96–112)
CO2: 28 mEq/L (ref 19–32)
Creatinine, Ser: 0.76 mg/dL (ref 0.40–1.20)
GFR: 82.26 mL/min (ref >60.00)
Glucose, Bld: 85 mg/dL (ref 70–99)
POTASSIUM: 4.4 meq/L (ref 3.5–5.1)
Sodium: 139 mEq/L (ref 135–145)
Total Bilirubin: 0.5 mg/dL (ref 0.2–1.2)
Total Protein: 6.6 g/dL (ref 6.0–8.3)

## 2015-03-29 LAB — TSH: TSH: 2.77 u[IU]/mL (ref 0.35–4.50)

## 2015-04-05 ENCOUNTER — Encounter: Payer: Self-pay | Admitting: Internal Medicine

## 2015-04-05 ENCOUNTER — Ambulatory Visit (INDEPENDENT_AMBULATORY_CARE_PROVIDER_SITE_OTHER): Payer: BC Managed Care – PPO | Admitting: Family Medicine

## 2015-04-05 ENCOUNTER — Encounter: Payer: Self-pay | Admitting: Family Medicine

## 2015-04-05 VITALS — BP 104/63 | HR 67 | Temp 98.6°F | Ht 63.5 in | Wt 141.0 lb

## 2015-04-05 DIAGNOSIS — Z0000 Encounter for general adult medical examination without abnormal findings: Secondary | ICD-10-CM | POA: Diagnosis not present

## 2015-04-05 NOTE — Progress Notes (Signed)
Pre visit review using our clinic review tool, if applicable. No additional management support is needed unless otherwise documented below in the visit note. 

## 2015-04-05 NOTE — Progress Notes (Signed)
   Subjective:    Patient ID: Latasha Marshall, female    DOB: [birth year], 59 y.o.   MRN: [id number]  HPI 59 yr old female for a cpx. She feels great and has no concerns. Her divorce was finalized last year and she recently moved into the new house that she built. 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    Review of Systems  Constitutional: Negative.   HENT: Negative.   Eyes: Negative.   Respiratory: Negative.   Cardiovascular: Negative.   Gastrointestinal: Negative.   Genitourinary: Negative for dysuria, urgency, frequency, hematuria, flank pain, decreased urine volume, enuresis, difficulty urinating, pelvic pain and dyspareunia.  Musculoskeletal: Negative.   Skin: Negative.   Neurological: Negative.   Psychiatric/Behavioral: Negative.        Objective:   Physical Exam  Constitutional: She is oriented to person, place, and time. She appears well-developed and well-nourished. No distress.  HENT:  Head: Normocephalic and atraumatic.  Right Ear: External ear normal.  Left Ear: External ear normal.  Nose: Nose normal.  Mouth/Throat: Oropharynx is clear and moist. No oropharyngeal exudate.  Eyes: Conjunctivae and EOM are normal. Pupils are equal, round, and reactive to light. No scleral icterus.  Neck: Normal range of motion. Neck supple. No JVD present. No thyromegaly present.  Cardiovascular: Normal rate, regular rhythm, normal heart sounds and intact distal pulses.  Exam reveals no gallop and no friction rub.   No murmur heard. EKG normal   Pulmonary/Chest: Effort normal and breath sounds normal. No respiratory distress. She has no wheezes. She has no rales. She exhibits no tenderness.  Abdominal: Soft. Bowel sounds are normal. She exhibits no distension and no mass. There is no tenderness. There is no rebound and no guarding.  Musculoskeletal: Normal range of motion. She exhibits no edema or tenderness.  Lymphadenopathy:    She has no  cervical adenopathy.  Neurological: She is alert and oriented to person, place, and time. She has normal reflexes. No cranial nerve deficit. She exhibits normal muscle tone. Coordination normal.  Skin: Skin is warm and dry. No rash noted. No erythema.  Psychiatric: She has a normal mood and affect. Her behavior is normal. Judgment and thought content normal.          Assessment & Plan:  Well exam. We reviewed diet and exercise recommendations. She is past due for a colonoscopy so she will contact Dr. Blanch Media office.

## 2015-05-30 ENCOUNTER — Ambulatory Visit (AMBULATORY_SURGERY_CENTER): Payer: Self-pay | Admitting: *Deleted

## 2015-05-30 ENCOUNTER — Encounter: Payer: Self-pay | Admitting: Internal Medicine

## 2015-05-30 VITALS — Ht 64.0 in | Wt 142.0 lb

## 2015-05-30 DIAGNOSIS — Z86010 Personal history of colonic polyps: Secondary | ICD-10-CM

## 2015-05-30 MED ORDER — MOVIPREP 100 G PO SOLR: ORAL | Status: DC

## 2015-05-30 NOTE — Progress Notes (Signed)
Patient denies any allergies to eggs or soy. Patient denies any problems with anesthesia/sedation. Patient denies any oxygen use at home and does not take any diet/weight loss medications. EMMI education assisgned to patient on colonoscopy, this was explained and instructions given to patient. 

## 2015-06-20 ENCOUNTER — Ambulatory Visit (AMBULATORY_SURGERY_CENTER): Payer: BC Managed Care – PPO | Admitting: Internal Medicine

## 2015-06-20 ENCOUNTER — Encounter: Payer: Self-pay | Admitting: Internal Medicine

## 2015-06-20 VITALS — BP 124/64 | HR 62 | Temp 98.6°F | Resp 19 | Ht 64.0 in | Wt 142.0 lb

## 2015-06-20 DIAGNOSIS — D124 Benign neoplasm of descending colon: Secondary | ICD-10-CM

## 2015-06-20 DIAGNOSIS — K635 Polyp of colon: Secondary | ICD-10-CM | POA: Diagnosis not present

## 2015-06-20 DIAGNOSIS — Z86010 Personal history of colonic polyps: Secondary | ICD-10-CM

## 2015-06-20 HISTORY — PX: COLONOSCOPY: SHX174

## 2015-06-20 MED ORDER — SODIUM CHLORIDE 0.9 % IV SOLN: 500.0000 mL | INTRAVENOUS | Status: DC

## 2015-06-20 NOTE — Progress Notes (Signed)
To recovery, report to Ennis, RN, VSS 

## 2015-06-20 NOTE — Op Note (Signed)
[place]  [address] [place], [id number]   COLONOSCOPY PROCEDURE REPORT  PATIENT: Latasha Marshall, Latasha Marshall  MR#: [id number] BIRTHDATE: 1956-07-02 , 59  yrs. old GENDER: female ENDOSCOPIST: Eustace Quail, MD REFERRED [id number] Program Recall PROCEDURE DATE:  06/20/2015 PROCEDURE:   Colonoscopy, surveillance and Colonoscopy with snare polypectomy x 1 First Screening Colonoscopy - Avg.  risk and is 50 yrs.  old or older - No.  Prior Negative Screening - Now for repeat screening. N/A  History of Adenoma - Now for follow-up colonoscopy & has been > or = to 3 yrs.  Yes hx of adenoma.  Has been 3 or more years since last colonoscopy.  Polyps removed today? Yes ASA CLASS:   Class II INDICATIONS:Screening for colonic neoplasia and PH Colon Adenoma. Index exam [place]; recent examination here 2010 with serrated adenoma. MEDICATIONS: Monitored anesthesia care and Propofol 250 mg IV  DESCRIPTION OF PROCEDURE:   After the risks benefits and alternatives of the procedure were thoroughly explained, informed consent was obtained.  The digital rectal exam revealed no abnormalities of the rectum.   The [id number]  endoscope was introduced through the anus and advanced to the cecum, which was identified by both the appendix and ileocecal valve. No adverse events experienced.   The quality of the prep was excellent. (MoviPrep was used)  The instrument was then slowly withdrawn as the colon was fully examined. Estimated blood loss is zero unless otherwise noted in this procedure report.   COLON FINDINGS: A single polyp measuring 2 mm in size was found in the descending colon.  A polypectomy was performed with a cold snare.  The resection was complete, the polyp tissue was completely retrieved and sent to histology.   The examination was otherwise normal.  Retroflexed views revealed internal hemorrhoids. The time to cecum = 2.6 Withdrawal time = 12.0   The  scope was withdrawn and the procedure completed. COMPLICATIONS: There were no immediate complications.  ENDOSCOPIC IMPRESSION: 1.   Single polyp was found in the descending colon; polypectomy was performed with a cold snare 2.   The examination was otherwise normal  RECOMMENDATIONS: Repeat colonoscopy in 5 years if polyp adenomatous; otherwise 10 years  eSigned:  Eustace Quail, MD 06/20/2015 11:37 AM   cc: The Patient and Laurey Morale, MD

## 2015-06-20 NOTE — Patient Instructions (Signed)
YOU HAD AN ENDOSCOPIC PROCEDURE TODAY AT THE [facility]:   Refer to the procedure report that was given to you for any specific questions about what was found during the examination.  If the procedure report does not answer your questions, please call your gastroenterologist to clarify.  If you requested that your care partner not be given the details of your procedure findings, then the procedure report has been included in a sealed envelope for you to review at your convenience later.  YOU SHOULD EXPECT: Some feelings of bloating in the abdomen. Passage of more gas than usual.  Walking can help get rid of the air that was put into your GI tract during the procedure and reduce the bloating. If you had a lower endoscopy (such as a colonoscopy or flexible sigmoidoscopy) you may notice spotting of blood in your stool or on the toilet paper. If you underwent a bowel prep for your procedure, you may not have a normal bowel movement for a few days.  Please Note:  You might notice some irritation and congestion in your nose or some drainage.  This is from the oxygen used during your procedure.  There is no need for concern and it should clear up in a day or so.  SYMPTOMS TO REPORT IMMEDIATELY:   Following lower endoscopy (colonoscopy or flexible sigmoidoscopy):  Excessive amounts of blood in the stool  Significant tenderness or worsening of abdominal pains  Swelling of the abdomen that is new, acute  Fever of 100F or higher   For urgent or emergent issues, a gastroenterologist can be reached at any hour by calling [phone].   DIET: Your first meal following the procedure should be a small meal and then it is ok to progress to your normal diet. Heavy or fried foods are harder to digest and may make you feel nauseous or bloated.  Likewise, meals heavy in dairy and vegetables can increase bloating.  Drink plenty of fluids but you should avoid alcoholic beverages for 24  hours.  ACTIVITY:  You should plan to take it easy for the rest of today and you should NOT DRIVE or use heavy machinery until tomorrow (because of the sedation medicines used during the test).    FOLLOW UP: Our staff will call the number listed on your records the next business day following your procedure to check on you and address any questions or concerns that you may have regarding the information given to you following your procedure. If we do not reach you, we will leave a message.  However, if you are feeling well and you are not experiencing any problems, there is no need to return our call.  We will assume that you have returned to your regular daily activities without incident.  If any biopsies were taken you will be contacted by phone or by letter within the next 1-3 weeks.  Please call us at [phone] if you have not heard about the biopsies in 3 weeks.    SIGNATURES/CONFIDENTIALITY: You and/or your care partner have signed paperwork which will be entered into your electronic medical record.  These signatures attest to the fact that that the information above on your After Visit Summary has been reviewed and is understood.  Full responsibility of the confidentiality of this discharge information lies with you and/or your care-partner.    Resume medications. Information given on polyps. 

## 2015-06-20 NOTE — Progress Notes (Signed)
Called to room to assist during endoscopic procedure.  Patient ID and intended procedure confirmed with present staff. Received instructions for my participation in the procedure from the performing physician.  

## 2015-06-21 ENCOUNTER — Telehealth: Payer: Self-pay | Admitting: *Deleted

## 2015-06-21 NOTE — Telephone Encounter (Signed)
  Follow up Call-  Call back number 06/20/2015  Post procedure Call Back phone  # [phone]  Permission to leave phone message Yes     Patient questions:  Do you have a fever, pain , or abdominal swelling? No. Pain Score  0 *  Have you tolerated food without any problems? Yes.    Have you been able to return to your normal activities? Yes.    Do you have any questions about your discharge instructions: Diet   No. Medications  No. Follow up visit  No.  Do you have questions or concerns about your Care? No.  Actions: * If pain score is 4 or above: No action needed, pain <4.

## 2015-06-28 ENCOUNTER — Encounter: Payer: Self-pay | Admitting: Internal Medicine

## 2015-08-16 ENCOUNTER — Telehealth: Payer: Self-pay | Admitting: Family Medicine

## 2015-08-16 NOTE — Telephone Encounter (Signed)
Pt needs new rx temazepam 15 mg # 30 w/refills [facility] [address]

## 2015-08-17 MED ORDER — TEMAZEPAM 15 MG PO CAPS: 15.0000 mg | ORAL_CAPSULE | Freq: Every evening | ORAL | Status: DC | PRN

## 2015-08-17 NOTE — Telephone Encounter (Signed)
I called in script 

## 2015-08-17 NOTE — Telephone Encounter (Signed)
Call in #30 with 5 rf 

## 2016-04-23 ENCOUNTER — Other Ambulatory Visit (INDEPENDENT_AMBULATORY_CARE_PROVIDER_SITE_OTHER): Payer: BC Managed Care – PPO

## 2016-04-23 DIAGNOSIS — Z0000 Encounter for general adult medical examination without abnormal findings: Secondary | ICD-10-CM

## 2016-04-23 LAB — HEPATIC FUNCTION PANEL
ALT: 16 U/L (ref 0–35)
AST: 20 U/L (ref 0–37)
Albumin: 4 g/dL (ref 3.5–5.2)
Alkaline Phosphatase: 79 U/L (ref 39–117)
BILIRUBIN DIRECT: 0.1 mg/dL (ref 0.0–0.3)
BILIRUBIN TOTAL: 0.5 mg/dL (ref 0.2–1.2)
Total Protein: 6.6 g/dL (ref 6.0–8.3)

## 2016-04-23 LAB — POC URINALSYSI DIPSTICK (AUTOMATED)
BILIRUBIN UA: NEGATIVE
Blood, UA: NEGATIVE
GLUCOSE UA: NEGATIVE
KETONES UA: NEGATIVE
LEUKOCYTES UA: NEGATIVE
Nitrite, UA: NEGATIVE
Protein, UA: NEGATIVE
Spec Grav, UA: 1.015
Urobilinogen, UA: 0.2
pH, UA: 7

## 2016-04-23 LAB — BASIC METABOLIC PANEL
BUN: 10 mg/dL (ref 6–23)
CO2: 29 mEq/L (ref 19–32)
CREATININE: 0.69 mg/dL (ref 0.40–1.20)
Calcium: 9 mg/dL (ref 8.4–10.5)
Chloride: 104 mEq/L (ref 96–112)
GFR: 91.64 mL/min (ref >60.00)
GLUCOSE: 83 mg/dL (ref 70–99)
POTASSIUM: 3.7 meq/L (ref 3.5–5.1)
Sodium: 137 mEq/L (ref 135–145)

## 2016-04-23 LAB — LIPID PANEL
CHOLESTEROL: 147 mg/dL (ref 0–200)
HDL: 48.6 mg/dL (ref >39.00)
LDL Cholesterol: 74 mg/dL (ref 0–99)
NonHDL: 98.27
TRIGLYCERIDES: 121 mg/dL (ref 0.0–149.0)
Total CHOL/HDL Ratio: 3
VLDL: 24.2 mg/dL (ref 0.0–40.0)

## 2016-04-23 LAB — TSH: TSH: 6.4 u[IU]/mL — AB (ref 0.35–4.50)

## 2016-04-24 LAB — CBC WITH DIFFERENTIAL/PLATELET
Basophils Absolute: 0 10*3/uL (ref 0.0–0.1)
Basophils Relative: 0.4 % (ref 0.0–3.0)
EOS ABS: 0.1 10*3/uL (ref 0.0–0.7)
EOS PCT: 1.8 % (ref 0.0–5.0)
HCT: 38.9 % (ref 36.0–46.0)
Hemoglobin: 13 g/dL (ref 12.0–15.0)
LYMPHS ABS: 1.5 10*3/uL (ref 0.7–4.0)
Lymphocytes Relative: 32.3 % (ref 12.0–46.0)
MCHC: 33.4 g/dL (ref 30.0–36.0)
MCV: 84.7 fl (ref 78.0–100.0)
MONO ABS: 0.3 10*3/uL (ref 0.1–1.0)
Monocytes Relative: 7.1 % (ref 3.0–12.0)
NEUTROS PCT: 58.4 % (ref 43.0–77.0)
Neutro Abs: 2.7 10*3/uL (ref 1.4–7.7)
Platelets: 196 10*3/uL (ref 150.0–400.0)
RBC: 4.59 Mil/uL (ref 3.87–5.11)
RDW: 14.5 % (ref 11.5–15.5)
WBC: 4.6 10*3/uL (ref 4.0–10.5)

## 2016-05-03 ENCOUNTER — Ambulatory Visit (INDEPENDENT_AMBULATORY_CARE_PROVIDER_SITE_OTHER): Payer: BC Managed Care – PPO | Admitting: Family Medicine

## 2016-05-03 ENCOUNTER — Encounter: Payer: Self-pay | Admitting: Family Medicine

## 2016-05-03 VITALS — BP 120/74 | HR 68 | Temp 98.4°F | Ht 64.0 in | Wt 143.0 lb

## 2016-05-03 DIAGNOSIS — R7989 Other specified abnormal findings of blood chemistry: Secondary | ICD-10-CM

## 2016-05-03 DIAGNOSIS — Z209 Contact with and (suspected) exposure to unspecified communicable disease: Secondary | ICD-10-CM | POA: Diagnosis not present

## 2016-05-03 DIAGNOSIS — Z0000 Encounter for general adult medical examination without abnormal findings: Secondary | ICD-10-CM

## 2016-05-03 LAB — T4, FREE: FREE T4: 0.87 ng/dL (ref 0.60–1.60)

## 2016-05-03 LAB — T3, FREE: T3 FREE: 3.5 pg/mL (ref 2.3–4.2)

## 2016-05-03 LAB — TSH: TSH: 3 u[IU]/mL (ref 0.35–4.50)

## 2016-05-03 MED ORDER — TEMAZEPAM 15 MG PO CAPS: 15.0000 mg | ORAL_CAPSULE | Freq: Every evening | ORAL | 1 refills | Status: DC | PRN

## 2016-05-03 NOTE — Progress Notes (Signed)
   Subjective:    Patient ID: Latasha Marshall, female    DOB: [birth year], 60 y.o.   MRN: [id number]  HPI 60 yr old female for a well exam. She feels great except for one issue. About 2 weeks ago her dentist made a mouth guard for her to wear at night since she grinds her teeth. After wearing this for one week she developed a cluster of very painful lesions on the roof of her mouth. She has now stopped wearing the guard until her follow up visit with her dentist next week.    Review of Systems  Constitutional: Negative.   HENT: Positive for dental problem and mouth sores. Negative for congestion, drooling, ear discharge, ear pain, facial swelling, hearing loss, nosebleeds, postnasal drip, rhinorrhea, sinus pressure, sneezing, sore throat, tinnitus, trouble swallowing and voice change.   Eyes: Negative.   Respiratory: Negative.   Cardiovascular: Negative.   Gastrointestinal: Negative.   Genitourinary: Negative for decreased urine volume, difficulty urinating, dyspareunia, dysuria, enuresis, flank pain, frequency, hematuria, pelvic pain and urgency.  Musculoskeletal: Negative.   Skin: Negative.   Neurological: Negative.   Psychiatric/Behavioral: Negative.        Objective:   Physical Exam  Constitutional: She is oriented to person, place, and time. She appears well-developed and well-nourished. No distress.  HENT:  Head: Normocephalic and atraumatic.  Right Ear: External ear normal.  Left Ear: External ear normal.  Nose: Nose normal.  Mouth/Throat: No oropharyngeal exudate.  The right hard palate has a cluster of large aphthous ulcers  Eyes: Conjunctivae and EOM are normal. Pupils are equal, round, and reactive to light. No scleral icterus.  Neck: Normal range of motion. Neck supple. No JVD present. No thyromegaly present.  Cardiovascular: Normal rate, regular rhythm, normal heart sounds and intact distal pulses.  Exam reveals no gallop and no friction rub.   No murmur heard. EKG  normal   Pulmonary/Chest: Effort normal and breath sounds normal. No respiratory distress. She has no wheezes. She has no rales. She exhibits no tenderness.  Abdominal: Soft. Bowel sounds are normal. She exhibits no distension and no mass. There is no tenderness. There is no rebound and no guarding.  Musculoskeletal: Normal range of motion. She exhibits no edema or tenderness.  Lymphadenopathy:    She has no cervical adenopathy.  Neurological: She is alert and oriented to person, place, and time. She has normal reflexes. No cranial nerve deficit. She exhibits normal muscle tone. Coordination normal.  Skin: Skin is warm and dry. No rash noted. No erythema.  Psychiatric: She has a normal mood and affect. Her behavior is normal. Judgment and thought content normal.          Assessment & Plan:  Well exam. We discussed diet and exercise. She has some aphthous ulcers in the mouth which are probably the result of trauma from an ill fitting mouth guard. She will talk to the dentist about this so the guard can be adjusted. Reassured her the ulcers are painful but benign. She can use salt water rinses.  Laurey Morale, MD

## 2016-05-03 NOTE — Progress Notes (Signed)
Pre visit review using our clinic review tool, if applicable. No additional management support is needed unless otherwise documented below in the visit note. 

## 2016-05-04 LAB — HEPATITIS C ANTIBODY: HCV Ab: NEGATIVE

## 2016-05-10 ENCOUNTER — Encounter: Payer: Self-pay | Admitting: Family Medicine

## 2016-09-13 ENCOUNTER — Telehealth: Payer: Self-pay | Admitting: Family Medicine

## 2016-09-13 NOTE — Telephone Encounter (Signed)
Pt is asking for Rx to help her sleep during overseas flight to [place].  Pt is leaving 09/27/16.

## 2016-09-17 MED ORDER — ALPRAZOLAM 0.5 MG PO TABS: ORAL_TABLET | ORAL | 0 refills | Status: DC

## 2016-09-17 NOTE — Telephone Encounter (Signed)
[facility] and left a voice message for pt with this information.

## 2016-09-17 NOTE — Telephone Encounter (Signed)
Call in Xanax 0.5 mg to take 1 or 2 tabs as needed for travel, #30 with no rf

## 2016-09-17 NOTE — Addendum Note (Signed)
Addended by: Aggie Hacker A on: 09/17/2016 09:25 AM   Modules accepted: Orders

## 2017-05-13 ENCOUNTER — Ambulatory Visit (INDEPENDENT_AMBULATORY_CARE_PROVIDER_SITE_OTHER): Payer: BC Managed Care – PPO | Admitting: Family Medicine

## 2017-05-13 ENCOUNTER — Encounter: Payer: Self-pay | Admitting: Family Medicine

## 2017-05-13 VITALS — BP 113/84 | HR 56 | Temp 98.6°F | Ht 64.0 in | Wt 139.0 lb

## 2017-05-13 DIAGNOSIS — Z0000 Encounter for general adult medical examination without abnormal findings: Secondary | ICD-10-CM | POA: Diagnosis not present

## 2017-05-13 LAB — HEPATIC FUNCTION PANEL
ALK PHOS: 79 U/L (ref 39–117)
ALT: 16 U/L (ref 0–35)
AST: 19 U/L (ref 0–37)
Albumin: 4.3 g/dL (ref 3.5–5.2)
BILIRUBIN DIRECT: 0.1 mg/dL (ref 0.0–0.3)
BILIRUBIN TOTAL: 0.7 mg/dL (ref 0.2–1.2)
Total Protein: 6.9 g/dL (ref 6.0–8.3)

## 2017-05-13 LAB — LIPID PANEL
CHOL/HDL RATIO: 3
Cholesterol: 164 mg/dL (ref 0–200)
HDL: 61.9 mg/dL (ref >39.00)
LDL CALC: 81 mg/dL (ref 0–99)
NONHDL: 102.12
Triglycerides: 104 mg/dL (ref 0.0–149.0)
VLDL: 20.8 mg/dL (ref 0.0–40.0)

## 2017-05-13 LAB — CBC WITH DIFFERENTIAL/PLATELET
BASOS ABS: 0 10*3/uL (ref 0.0–0.1)
Basophils Relative: 0.7 % (ref 0.0–3.0)
EOS PCT: 1.6 % (ref 0.0–5.0)
Eosinophils Absolute: 0.1 10*3/uL (ref 0.0–0.7)
HEMATOCRIT: 42.5 % (ref 36.0–46.0)
Hemoglobin: 14.2 g/dL (ref 12.0–15.0)
LYMPHS PCT: 29.9 % (ref 12.0–46.0)
Lymphs Abs: 1.5 10*3/uL (ref 0.7–4.0)
MCHC: 33.5 g/dL (ref 30.0–36.0)
MCV: 89.7 fl (ref 78.0–100.0)
MONOS PCT: 8.8 % (ref 3.0–12.0)
Monocytes Absolute: 0.4 10*3/uL (ref 0.1–1.0)
NEUTROS ABS: 3 10*3/uL (ref 1.4–7.7)
Neutrophils Relative %: 59 % (ref 43.0–77.0)
PLATELETS: 205 10*3/uL (ref 150.0–400.0)
RBC: 4.74 Mil/uL (ref 3.87–5.11)
RDW: 12.7 % (ref 11.5–15.5)
WBC: 5.1 10*3/uL (ref 4.0–10.5)

## 2017-05-13 LAB — BASIC METABOLIC PANEL
BUN: 11 mg/dL (ref 6–23)
CO2: 29 mEq/L (ref 19–32)
Calcium: 9.3 mg/dL (ref 8.4–10.5)
Chloride: 104 mEq/L (ref 96–112)
Creatinine, Ser: 0.74 mg/dL (ref 0.40–1.20)
GFR: 84.24 mL/min (ref >60.00)
Glucose, Bld: 85 mg/dL (ref 70–99)
POTASSIUM: 4.1 meq/L (ref 3.5–5.1)
SODIUM: 140 meq/L (ref 135–145)

## 2017-05-13 LAB — POC URINALSYSI DIPSTICK (AUTOMATED)
BILIRUBIN UA: NEGATIVE
Blood, UA: NEGATIVE
Glucose, UA: NEGATIVE
Ketones, UA: NEGATIVE
LEUKOCYTES UA: NEGATIVE
NITRITE UA: NEGATIVE
PH UA: 7 (ref 5.0–8.0)
Protein, UA: NEGATIVE
Spec Grav, UA: 1.01 (ref 1.010–1.025)
Urobilinogen, UA: 0.2 E.U./dL

## 2017-05-13 LAB — TSH: TSH: 2.55 u[IU]/mL (ref 0.35–4.50)

## 2017-05-13 MED ORDER — TEMAZEPAM 15 MG PO CAPS: 15.0000 mg | ORAL_CAPSULE | Freq: Every evening | ORAL | 1 refills | Status: DC | PRN

## 2017-05-13 NOTE — Progress Notes (Signed)
   Subjective:    Patient ID: Latasha Marshall, female    DOB: 04/18/1956, 61 y.o.   MRN: [id number]  HPI Here for a well exam. She is doing weill and feels great. Her last DEXA at her GYN's office in 2017 sowed osteoporosis but this had improved from the one in 2015.    Review of Systems  Constitutional: Negative.   HENT: Negative.   Eyes: Negative.   Respiratory: Negative.   Cardiovascular: Negative.   Gastrointestinal: Negative.   Genitourinary: Negative for decreased urine volume, difficulty urinating, dyspareunia, dysuria, enuresis, flank pain, frequency, hematuria, pelvic pain and urgency.  Musculoskeletal: Negative.   Skin: Negative.   Neurological: Negative.   Psychiatric/Behavioral: Negative.        Objective:   Physical Exam  Constitutional: She is oriented to person, place, and time. She appears well-developed and well-nourished. No distress.  HENT:  Head: Normocephalic and atraumatic.  Right Ear: External ear normal.  Left Ear: External ear normal.  Nose: Nose normal.  Mouth/Throat: Oropharynx is clear and moist. No oropharyngeal exudate.  Eyes: Pupils are equal, round, and reactive to light. Conjunctivae and EOM are normal. No scleral icterus.  Neck: Normal range of motion. Neck supple. No JVD present. No thyromegaly present.  Cardiovascular: Normal rate, regular rhythm, normal heart sounds and intact distal pulses.  Exam reveals no gallop and no friction rub.   No murmur heard. Pulmonary/Chest: Effort normal and breath sounds normal. No respiratory distress. She has no wheezes. She has no rales. She exhibits no tenderness.  Abdominal: Soft. Bowel sounds are normal. She exhibits no distension and no mass. There is no tenderness. There is no rebound and no guarding.  Musculoskeletal: Normal range of motion. She exhibits no edema or tenderness.  Lymphadenopathy:    She has no cervical adenopathy.  Neurological: She is alert and oriented to person, place, and time.  She has normal reflexes. No cranial nerve deficit. She exhibits normal muscle tone. Coordination normal.  Skin: Skin is warm and dry. No rash noted. No erythema.  Psychiatric: She has a normal mood and affect. Her behavior is normal. Judgment and thought content normal.          Assessment & Plan:  Well exam. We discussed diet and exercise. Get fasting labs.  Alysia Penna, MD

## 2017-05-13 NOTE — Patient Instructions (Signed)
WE NOW OFFER   Latasha Marshall's FAST TRACK!!!  SAME DAY Appointments for ACUTE CARE  Such as: Sprains, Injuries, cuts, abrasions, rashes, muscle pain, joint pain, back pain Colds, flu, sore throats, headache, allergies, cough, fever  Ear pain, sinus and eye infections Abdominal pain, nausea, vomiting, diarrhea, upset stomach Animal/insect bites  3 Easy Ways to Schedule: Walk-In Scheduling Call in scheduling Mychart Sign-up: [email or web address]         

## 2017-06-27 ENCOUNTER — Encounter: Payer: Self-pay | Admitting: Family Medicine

## 2018-01-14 ENCOUNTER — Other Ambulatory Visit: Payer: Self-pay | Admitting: Family Medicine

## 2018-01-14 NOTE — Telephone Encounter (Signed)
Last OV 05/13/2017   Last refilled 05/13/2017 disp 90 with 1 refill   Sent to PCP for approval

## 2018-01-17 NOTE — Telephone Encounter (Signed)
Call in #90 with one rf 

## 2018-05-15 ENCOUNTER — Ambulatory Visit (INDEPENDENT_AMBULATORY_CARE_PROVIDER_SITE_OTHER): Payer: BC Managed Care – PPO | Admitting: Family Medicine

## 2018-05-15 ENCOUNTER — Encounter: Payer: Self-pay | Admitting: Family Medicine

## 2018-05-15 VITALS — BP 122/80 | HR 62 | Temp 98.2°F | Ht 64.0 in | Wt 142.4 lb

## 2018-05-15 DIAGNOSIS — Z23 Encounter for immunization: Secondary | ICD-10-CM

## 2018-05-15 DIAGNOSIS — Z0000 Encounter for general adult medical examination without abnormal findings: Secondary | ICD-10-CM | POA: Diagnosis not present

## 2018-05-15 LAB — POC URINALSYSI DIPSTICK (AUTOMATED)
Bilirubin, UA: NEGATIVE
Glucose, UA: NEGATIVE
Ketones, UA: NEGATIVE
LEUKOCYTES UA: NEGATIVE
Nitrite, UA: NEGATIVE
PH UA: 7 (ref 5.0–8.0)
PROTEIN UA: NEGATIVE
RBC UA: NEGATIVE
SPEC GRAV UA: 1.01 (ref 1.010–1.025)
Urobilinogen, UA: 0.2 E.U./dL

## 2018-05-15 LAB — HEPATIC FUNCTION PANEL
ALBUMIN: 4.5 g/dL (ref 3.5–5.2)
ALT: 17 U/L (ref 0–35)
AST: 20 U/L (ref 0–37)
Alkaline Phosphatase: 93 U/L (ref 39–117)
Bilirubin, Direct: 0.1 mg/dL (ref 0.0–0.3)
Total Bilirubin: 0.6 mg/dL (ref 0.2–1.2)
Total Protein: 7.3 g/dL (ref 6.0–8.3)

## 2018-05-15 LAB — CBC WITH DIFFERENTIAL/PLATELET
BASOS ABS: 0 10*3/uL (ref 0.0–0.1)
Basophils Relative: 0.8 % (ref 0.0–3.0)
EOS ABS: 0.1 10*3/uL (ref 0.0–0.7)
Eosinophils Relative: 2 % (ref 0.0–5.0)
HCT: 42.6 % (ref 36.0–46.0)
Hemoglobin: 14.5 g/dL (ref 12.0–15.0)
LYMPHS ABS: 1.4 10*3/uL (ref 0.7–4.0)
LYMPHS PCT: 31.6 % (ref 12.0–46.0)
MCHC: 34.1 g/dL (ref 30.0–36.0)
MCV: 87.5 fl (ref 78.0–100.0)
MONO ABS: 0.4 10*3/uL (ref 0.1–1.0)
Monocytes Relative: 9.3 % (ref 3.0–12.0)
NEUTROS ABS: 2.5 10*3/uL (ref 1.4–7.7)
NEUTROS PCT: 56.3 % (ref 43.0–77.0)
PLATELETS: 218 10*3/uL (ref 150.0–400.0)
RBC: 4.87 Mil/uL (ref 3.87–5.11)
RDW: 12.5 % (ref 11.5–15.5)
WBC: 4.5 10*3/uL (ref 4.0–10.5)

## 2018-05-15 LAB — LIPID PANEL
CHOLESTEROL: 184 mg/dL (ref 0–200)
HDL: 59.9 mg/dL (ref >39.00)
LDL Cholesterol: 91 mg/dL (ref 0–99)
NonHDL: 123.67
TRIGLYCERIDES: 163 mg/dL — AB (ref 0.0–149.0)
Total CHOL/HDL Ratio: 3
VLDL: 32.6 mg/dL (ref 0.0–40.0)

## 2018-05-15 LAB — BASIC METABOLIC PANEL
BUN: 10 mg/dL (ref 6–23)
CO2: 29 mEq/L (ref 19–32)
Calcium: 9.6 mg/dL (ref 8.4–10.5)
Chloride: 102 mEq/L (ref 96–112)
Creatinine, Ser: 0.7 mg/dL (ref 0.40–1.20)
GFR: 89.53 mL/min (ref >60.00)
GLUCOSE: 92 mg/dL (ref 70–99)
POTASSIUM: 4.6 meq/L (ref 3.5–5.1)
Sodium: 140 mEq/L (ref 135–145)

## 2018-05-15 LAB — TSH: TSH: 3.38 u[IU]/mL (ref 0.35–4.50)

## 2018-05-15 MED ORDER — TEMAZEPAM 15 MG PO CAPS: 15.0000 mg | ORAL_CAPSULE | Freq: Every evening | ORAL | 5 refills | Status: DC | PRN

## 2018-05-15 NOTE — Progress Notes (Signed)
   Subjective:    Patient ID: Latasha Marshall, female    DOB: 18-Jun-1956, 62 y.o.   MRN: [id number]  HPI Here for a well exam. She feels fine.    Review of Systems  Constitutional: Negative.   HENT: Negative.   Eyes: Negative.   Respiratory: Negative.   Cardiovascular: Negative.   Gastrointestinal: Negative.   Genitourinary: Negative for decreased urine volume, difficulty urinating, dyspareunia, dysuria, enuresis, flank pain, frequency, hematuria, pelvic pain and urgency.  Musculoskeletal: Negative.   Skin: Negative.   Neurological: Negative.   Psychiatric/Behavioral: Negative.        Objective:   Physical Exam  Constitutional: She is oriented to person, place, and time. She appears well-developed and well-nourished. No distress.  HENT:  Head: Normocephalic and atraumatic.  Right Ear: External ear normal.  Left Ear: External ear normal.  Nose: Nose normal.  Mouth/Throat: Oropharynx is clear and moist. No oropharyngeal exudate.  Eyes: Pupils are equal, round, and reactive to light. Conjunctivae and EOM are normal. No scleral icterus.  Neck: Normal range of motion. Neck supple. No JVD present. No thyromegaly present.  Cardiovascular: Normal rate, regular rhythm, normal heart sounds and intact distal pulses. Exam reveals no gallop and no friction rub.  No murmur heard. Pulmonary/Chest: Effort normal and breath sounds normal. No respiratory distress. She has no wheezes. She has no rales. She exhibits no tenderness.  Abdominal: Soft. Bowel sounds are normal. She exhibits no distension and no mass. There is no tenderness. There is no rebound and no guarding.  Musculoskeletal: Normal range of motion. She exhibits no edema or tenderness.  Lymphadenopathy:    She has no cervical adenopathy.  Neurological: She is alert and oriented to person, place, and time. She has normal reflexes. She displays normal reflexes. No cranial nerve deficit. She exhibits normal muscle tone. Coordination  normal.  Skin: Skin is warm and dry. No rash noted. No erythema.  Psychiatric: She has a normal mood and affect. Her behavior is normal. Judgment and thought content normal.          Assessment & Plan:  Well exam. We discussed diet and exercise. Get fasting labs. Given a Prevnar vaccine and she will get Shingrix at her pharmacy. Alysia Penna, MD

## 2018-11-25 ENCOUNTER — Other Ambulatory Visit: Payer: Self-pay | Admitting: Family Medicine

## 2018-11-26 NOTE — Telephone Encounter (Signed)
Last refill given on 8/8 for #30 with 5 ref

## 2019-05-26 ENCOUNTER — Encounter: Payer: Self-pay | Admitting: Family Medicine

## 2019-05-26 ENCOUNTER — Ambulatory Visit (INDEPENDENT_AMBULATORY_CARE_PROVIDER_SITE_OTHER): Payer: BC Managed Care – PPO | Admitting: Family Medicine

## 2019-05-26 VITALS — BP 130/70 | HR 86 | Temp 98.3°F | Wt 142.3 lb

## 2019-05-26 DIAGNOSIS — M899 Disorder of bone, unspecified: Secondary | ICD-10-CM

## 2019-05-26 DIAGNOSIS — M949 Disorder of cartilage, unspecified: Secondary | ICD-10-CM | POA: Diagnosis not present

## 2019-05-26 DIAGNOSIS — Z23 Encounter for immunization: Secondary | ICD-10-CM | POA: Diagnosis not present

## 2019-05-26 DIAGNOSIS — Z0000 Encounter for general adult medical examination without abnormal findings: Secondary | ICD-10-CM | POA: Diagnosis not present

## 2019-05-26 LAB — CBC WITH DIFFERENTIAL/PLATELET
Basophils Absolute: 0 10*3/uL (ref 0.0–0.1)
Basophils Relative: 0.8 % (ref 0.0–3.0)
Eosinophils Absolute: 0.1 10*3/uL (ref 0.0–0.7)
Eosinophils Relative: 1.5 % (ref 0.0–5.0)
HCT: 44.4 % (ref 36.0–46.0)
Hemoglobin: 14.9 g/dL (ref 12.0–15.0)
Lymphocytes Relative: 31.7 % (ref 12.0–46.0)
Lymphs Abs: 1.8 10*3/uL (ref 0.7–4.0)
MCHC: 33.5 g/dL (ref 30.0–36.0)
MCV: 89.8 fl (ref 78.0–100.0)
Monocytes Absolute: 0.5 10*3/uL (ref 0.1–1.0)
Monocytes Relative: 9 % (ref 3.0–12.0)
Neutro Abs: 3.2 10*3/uL (ref 1.4–7.7)
Neutrophils Relative %: 57 % (ref 43.0–77.0)
Platelets: 197 10*3/uL (ref 150.0–400.0)
RBC: 4.94 Mil/uL (ref 3.87–5.11)
RDW: 12.3 % (ref 11.5–15.5)
WBC: 5.6 10*3/uL (ref 4.0–10.5)

## 2019-05-26 LAB — HEPATIC FUNCTION PANEL
ALT: 20 U/L (ref 0–35)
AST: 27 U/L (ref 0–37)
Albumin: 4.6 g/dL (ref 3.5–5.2)
Alkaline Phosphatase: 85 U/L (ref 39–117)
Bilirubin, Direct: 0.1 mg/dL (ref 0.0–0.3)
Total Bilirubin: 0.6 mg/dL (ref 0.2–1.2)
Total Protein: 7.4 g/dL (ref 6.0–8.3)

## 2019-05-26 LAB — LIPID PANEL
Cholesterol: 185 mg/dL (ref 0–200)
HDL: 63.3 mg/dL (ref >39.00)
LDL Cholesterol: 97 mg/dL (ref 0–99)
NonHDL: 121.46
Total CHOL/HDL Ratio: 3
Triglycerides: 120 mg/dL (ref 0.0–149.0)
VLDL: 24 mg/dL (ref 0.0–40.0)

## 2019-05-26 LAB — TSH: TSH: 2.19 u[IU]/mL (ref 0.35–4.50)

## 2019-05-26 LAB — BASIC METABOLIC PANEL
BUN: 13 mg/dL (ref 6–23)
CO2: 29 mEq/L (ref 19–32)
Calcium: 9.5 mg/dL (ref 8.4–10.5)
Chloride: 103 mEq/L (ref 96–112)
Creatinine, Ser: 0.79 mg/dL (ref 0.40–1.20)
GFR: 73.03 mL/min (ref >60.00)
Glucose, Bld: 81 mg/dL (ref 70–99)
Potassium: 4.6 mEq/L (ref 3.5–5.1)
Sodium: 140 mEq/L (ref 135–145)

## 2019-05-26 LAB — VITAMIN D 25 HYDROXY (VIT D DEFICIENCY, FRACTURES): VITD: 40.45 ng/mL (ref 30.00–100.00)

## 2019-05-26 MED ORDER — TEMAZEPAM 15 MG PO CAPS: 15.0000 mg | ORAL_CAPSULE | Freq: Every evening | ORAL | 5 refills | Status: DC | PRN

## 2019-05-26 NOTE — Progress Notes (Signed)
   Subjective:    Patient ID: CAROLANN BRAZELL, female    DOB: 1956-03-06, 63 y.o.   MRN: [id number]  HPI Here for a well exam. She feels great.    Review of Systems  Constitutional: Negative.   HENT: Negative.   Eyes: Negative.   Respiratory: Negative.   Cardiovascular: Negative.   Gastrointestinal: Negative.   Genitourinary: Negative for decreased urine volume, difficulty urinating, dyspareunia, dysuria, enuresis, flank pain, frequency, hematuria, pelvic pain and urgency.  Musculoskeletal: Negative.   Skin: Negative.   Neurological: Negative.   Psychiatric/Behavioral: Negative.        Objective:   Physical Exam Constitutional:      General: She is not in acute distress.    Appearance: She is well-developed.  HENT:     Head: Normocephalic and atraumatic.     Right Ear: External ear normal.     Left Ear: External ear normal.     Nose: Nose normal.     Mouth/Throat:     Pharynx: No oropharyngeal exudate.  Eyes:     General: No scleral icterus.    Conjunctiva/sclera: Conjunctivae normal.     Pupils: Pupils are equal, round, and reactive to light.  Neck:     Musculoskeletal: Normal range of motion and neck supple.     Thyroid: No thyromegaly.     Vascular: No JVD.  Cardiovascular:     Rate and Rhythm: Normal rate and regular rhythm.     Heart sounds: Normal heart sounds. No murmur. No friction rub. No gallop.   Pulmonary:     Effort: Pulmonary effort is normal. No respiratory distress.     Breath sounds: Normal breath sounds. No wheezing or rales.  Chest:     Chest wall: No tenderness.  Abdominal:     General: Bowel sounds are normal. There is no distension.     Palpations: Abdomen is soft. There is no mass.     Tenderness: There is no abdominal tenderness. There is no guarding or rebound.  Musculoskeletal: Normal range of motion.        General: No tenderness.  Lymphadenopathy:     Cervical: No cervical adenopathy.  Skin:    General: Skin is warm and dry.      Findings: No erythema or rash.  Neurological:     Mental Status: She is alert and oriented to person, place, and time.     Cranial Nerves: No cranial nerve deficit.     Motor: No abnormal muscle tone.     Coordination: Coordination normal.     Deep Tendon Reflexes: Reflexes are normal and symmetric. Reflexes normal.  Psychiatric:        Behavior: Behavior normal.        Thought Content: Thought content normal.        Judgment: Judgment normal.           Assessment & Plan:  Well exam. We discussed diet and exercise. Get fasting labs.  Alysia Penna, MD

## 2019-06-08 ENCOUNTER — Telehealth: Payer: Self-pay | Admitting: Family Medicine

## 2019-06-08 MED ORDER — ALPRAZOLAM 0.5 MG PO TBDP: 0.5000 mg | ORAL_TABLET | Freq: Three times a day (TID) | ORAL | 0 refills | Status: DC | PRN

## 2019-06-08 NOTE — Telephone Encounter (Signed)
I sent in some Xanax (which she has taken before)

## 2019-06-08 NOTE — Telephone Encounter (Signed)
Patient is aware. Nothing further needed.  

## 2019-06-08 NOTE — Telephone Encounter (Signed)
See note

## 2019-06-08 NOTE — Telephone Encounter (Signed)
Pt stated she is flying to [facility] on Wednesday morning for her child's wedding and is very anxious about that and flying during this pandemic. She would like to know if Dr. Sarajane Jews could send rx to her pharmacy to help with the anxiety. Please advise.  [id number] [place] [place] [facility] [address] [phone] (Phone

## 2019-10-16 ENCOUNTER — Other Ambulatory Visit: Payer: BC Managed Care – PPO

## 2019-10-22 ENCOUNTER — Other Ambulatory Visit: Payer: BC Managed Care – PPO

## 2019-11-09 ENCOUNTER — Ambulatory Visit: Payer: BC Managed Care – PPO

## 2019-11-15 ENCOUNTER — Ambulatory Visit: Payer: BC Managed Care – PPO

## 2020-04-15 ENCOUNTER — Other Ambulatory Visit: Payer: Self-pay | Admitting: Family Medicine

## 2020-04-18 NOTE — Telephone Encounter (Signed)
Last filled 06/08/2019 last OV 05/26/2019  Ok to fill?

## 2020-06-01 ENCOUNTER — Ambulatory Visit (INDEPENDENT_AMBULATORY_CARE_PROVIDER_SITE_OTHER): Payer: Medicare PPO | Admitting: Family Medicine

## 2020-06-01 ENCOUNTER — Other Ambulatory Visit: Payer: Self-pay

## 2020-06-01 ENCOUNTER — Encounter: Payer: Self-pay | Admitting: Family Medicine

## 2020-06-01 VITALS — BP 110/60 | HR 72 | Temp 98.0°F | Ht 64.0 in | Wt 138.6 lb

## 2020-06-01 DIAGNOSIS — Z0000 Encounter for general adult medical examination without abnormal findings: Secondary | ICD-10-CM | POA: Diagnosis not present

## 2020-06-01 NOTE — Progress Notes (Signed)
   Subjective:    Patient ID: Latasha Marshall, female    DOB: 05-09-1956, 64 y.o.   MRN: [id number]  HPI Here for a well exam. She feels great. She is now retired and she has joined a gym.    Review of Systems  Constitutional: Negative.   HENT: Negative.   Eyes: Negative.   Respiratory: Negative.   Cardiovascular: Negative.   Gastrointestinal: Negative.   Genitourinary: Negative for decreased urine volume, difficulty urinating, dyspareunia, dysuria, enuresis, flank pain, frequency, hematuria, pelvic pain and urgency.  Musculoskeletal: Negative.   Skin: Negative.   Neurological: Negative.   Psychiatric/Behavioral: Negative.        Objective:   Physical Exam Constitutional:      General: She is not in acute distress.    Appearance: She is well-developed.  HENT:     Head: Normocephalic and atraumatic.     Right Ear: External ear normal.     Left Ear: External ear normal.     Nose: Nose normal.     Mouth/Throat:     Pharynx: No oropharyngeal exudate.  Eyes:     General: No scleral icterus.    Conjunctiva/sclera: Conjunctivae normal.     Pupils: Pupils are equal, round, and reactive to light.  Neck:     Thyroid: No thyromegaly.     Vascular: No JVD.  Cardiovascular:     Rate and Rhythm: Normal rate and regular rhythm.     Heart sounds: Normal heart sounds. No murmur heard.  No friction rub. No gallop.   Pulmonary:     Effort: Pulmonary effort is normal. No respiratory distress.     Breath sounds: Normal breath sounds. No wheezing or rales.  Chest:     Chest wall: No tenderness.  Abdominal:     General: Bowel sounds are normal. There is no distension.     Palpations: Abdomen is soft. There is no mass.     Tenderness: There is no abdominal tenderness. There is no guarding or rebound.  Musculoskeletal:        General: No tenderness. Normal range of motion.     Cervical back: Normal range of motion and neck supple.  Lymphadenopathy:     Cervical: No cervical  adenopathy.  Skin:    General: Skin is warm and dry.     Findings: No erythema or rash.  Neurological:     Mental Status: She is alert and oriented to person, place, and time.     Cranial Nerves: No cranial nerve deficit.     Motor: No abnormal muscle tone.     Coordination: Coordination normal.     Deep Tendon Reflexes: Reflexes are normal and symmetric. Reflexes normal.  Psychiatric:        Behavior: Behavior normal.        Thought Content: Thought content normal.        Judgment: Judgment normal.           Assessment & Plan:  Well exam. We discussed diet ans exercise. Get fasting labs.  Alysia Penna, MD

## 2020-06-02 LAB — CBC WITH DIFFERENTIAL/PLATELET
Absolute Monocytes: 400 cells/uL (ref 200–950)
Basophils Absolute: 31 cells/uL (ref 0–200)
Basophils Relative: 0.7 %
Eosinophils Absolute: 79 cells/uL (ref 15–500)
Eosinophils Relative: 1.8 %
HCT: 45.2 % — ABNORMAL HIGH (ref 35.0–45.0)
Hemoglobin: 14.6 g/dL (ref 11.7–15.5)
Lymphs Abs: 1342 cells/uL (ref 850–3900)
MCH: 29.6 pg (ref 27.0–33.0)
MCHC: 32.3 g/dL (ref 32.0–36.0)
MCV: 91.5 fL (ref 80.0–100.0)
MPV: 9.6 fL (ref 7.5–12.5)
Monocytes Relative: 9.1 %
Neutro Abs: 2548 cells/uL (ref 1500–7800)
Neutrophils Relative %: 57.9 %
Platelets: 205 10*3/uL (ref 140–400)
RBC: 4.94 10*6/uL (ref 3.80–5.10)
RDW: 12 % (ref 11.0–15.0)
Total Lymphocyte: 30.5 %
WBC: 4.4 10*3/uL (ref 3.8–10.8)

## 2020-06-02 LAB — BASIC METABOLIC PANEL
BUN: 13 mg/dL (ref 7–25)
CO2: 28 mmol/L (ref 20–32)
Calcium: 9 mg/dL (ref 8.6–10.4)
Chloride: 104 mmol/L (ref 98–110)
Creat: 0.73 mg/dL (ref 0.50–0.99)
Glucose, Bld: 85 mg/dL (ref 65–99)
Potassium: 4.2 mmol/L (ref 3.5–5.3)
Sodium: 140 mmol/L (ref 135–146)

## 2020-06-02 LAB — LIPID PANEL
Cholesterol: 171 mg/dL (ref <200)
HDL: 66 mg/dL (ref >=50)
LDL Cholesterol (Calc): 84 mg/dL (calc)
Non-HDL Cholesterol (Calc): 105 mg/dL (calc) (ref <130)
Total CHOL/HDL Ratio: 2.6 (calc) (ref <5.0)
Triglycerides: 118 mg/dL (ref <150)

## 2020-06-02 LAB — HEPATIC FUNCTION PANEL
AG Ratio: 1.8 (calc) (ref 1.0–2.5)
ALT: 17 U/L (ref 6–29)
AST: 20 U/L (ref 10–35)
Albumin: 4.5 g/dL (ref 3.6–5.1)
Alkaline phosphatase (APISO): 84 U/L (ref 37–153)
Bilirubin, Direct: 0.1 mg/dL (ref 0.0–0.2)
Globulin: 2.5 g/dL (calc) (ref 1.9–3.7)
Indirect Bilirubin: 0.2 mg/dL (calc) (ref 0.2–1.2)
Total Bilirubin: 0.3 mg/dL (ref 0.2–1.2)
Total Protein: 7 g/dL (ref 6.1–8.1)

## 2020-06-02 LAB — VITAMIN D 25 HYDROXY (VIT D DEFICIENCY, FRACTURES): Vit D, 25-Hydroxy: 27 ng/mL — ABNORMAL LOW (ref 30–100)

## 2020-06-02 LAB — TSH: TSH: 3.5 mIU/L (ref 0.40–4.50)

## 2020-06-08 ENCOUNTER — Ambulatory Visit (INDEPENDENT_AMBULATORY_CARE_PROVIDER_SITE_OTHER): Payer: Medicare PPO

## 2020-06-08 ENCOUNTER — Telehealth: Payer: Self-pay | Admitting: Family Medicine

## 2020-06-08 ENCOUNTER — Other Ambulatory Visit: Payer: Self-pay

## 2020-06-08 DIAGNOSIS — Z111 Encounter for screening for respiratory tuberculosis: Secondary | ICD-10-CM

## 2020-06-08 NOTE — Progress Notes (Signed)
Per orders of Dr. Sarajane Jews,  TB skin test given by Rebecca Eaton. Patient tolerated injection well.

## 2020-06-08 NOTE — Telephone Encounter (Signed)
Health Examination Certificate to be filled out-- placed in dr's folder.  Call [phone] upon completion.

## 2020-06-09 NOTE — Telephone Encounter (Signed)
The form is ready  

## 2020-06-10 ENCOUNTER — Other Ambulatory Visit: Payer: Self-pay | Admitting: Family Medicine

## 2020-06-10 LAB — TB SKIN TEST
Induration: 0 mm
TB Skin Test: NEGATIVE

## 2020-09-07 DIAGNOSIS — Z1231 Encounter for screening mammogram for malignant neoplasm of breast: Secondary | ICD-10-CM | POA: Diagnosis not present

## 2020-12-14 ENCOUNTER — Other Ambulatory Visit: Payer: Self-pay | Admitting: Family Medicine

## 2020-12-14 NOTE — Telephone Encounter (Signed)
Last refill- 04/18/2020--30 tabs with 5 refills Last office visit- 06/01/2020  No future visit scheduled

## 2021-01-10 DIAGNOSIS — J029 Acute pharyngitis, unspecified: Secondary | ICD-10-CM | POA: Diagnosis not present

## 2021-01-10 DIAGNOSIS — J069 Acute upper respiratory infection, unspecified: Secondary | ICD-10-CM | POA: Diagnosis not present

## 2021-06-21 ENCOUNTER — Ambulatory Visit: Payer: Medicare PPO | Admitting: Physician Assistant

## 2021-06-21 ENCOUNTER — Other Ambulatory Visit: Payer: Self-pay

## 2021-06-21 ENCOUNTER — Encounter: Payer: Self-pay | Admitting: Physician Assistant

## 2021-06-21 DIAGNOSIS — L719 Rosacea, unspecified: Secondary | ICD-10-CM | POA: Diagnosis not present

## 2021-06-21 DIAGNOSIS — Z1283 Encounter for screening for malignant neoplasm of skin: Secondary | ICD-10-CM

## 2021-06-21 MED ORDER — METRONIDAZOLE 0.75 % EX CREA: TOPICAL_CREAM | Freq: Every day | CUTANEOUS | 11 refills | Status: AC

## 2021-06-21 NOTE — Progress Notes (Signed)
   Follow-Up Visit   Subjective  Latasha Marshall is a 65 y.o. female who presents for the following: Annual Exam (No history of skin cancer or atypia and the only concerns are the possible keratoses on the arms ).   The following portions of the chart were reviewed this encounter and updated as appropriate:  Tobacco  Allergies  Meds  Problems  Med Hx  Surg Hx  Fam Hx      Objective  Well appearing patient in no apparent distress; mood and affect are within normal limits.  A full examination was performed including scalp, head, eyes, ears, nose, lips, neck, chest, axillae, abdomen, back, buttocks, bilateral upper extremities, bilateral lower extremities, hands, feet, fingers, toes, fingernails, and toenails. All findings within normal limits unless otherwise noted below.  Anterior (Face) Slight xerosis and erythema   Assessment & Plan  Rosacea Anterior (Face)  metroNIDAZOLE (METROCREAM) 0.75 % cream - Anterior (Face) Apply topically daily.    I, Calie Buttrey, PA-C, have reviewed all documentation's for this visit.  The documentation on 06/21/21 for the exam, diagnosis, procedures and orders are all accurate and complete.

## 2021-06-21 NOTE — Patient Instructions (Addendum)
[facility] @ [facility] @ [phone]

## 2021-06-26 ENCOUNTER — Other Ambulatory Visit: Payer: Self-pay

## 2021-06-27 ENCOUNTER — Encounter: Payer: Self-pay | Admitting: Family Medicine

## 2021-06-27 ENCOUNTER — Ambulatory Visit (INDEPENDENT_AMBULATORY_CARE_PROVIDER_SITE_OTHER): Payer: Medicare PPO | Admitting: Family Medicine

## 2021-06-27 VITALS — BP 124/82 | HR 65 | Temp 98.3°F | Ht 64.0 in | Wt 142.0 lb

## 2021-06-27 DIAGNOSIS — Z23 Encounter for immunization: Secondary | ICD-10-CM | POA: Diagnosis not present

## 2021-06-27 DIAGNOSIS — Z0000 Encounter for general adult medical examination without abnormal findings: Secondary | ICD-10-CM | POA: Diagnosis not present

## 2021-06-27 LAB — BASIC METABOLIC PANEL
BUN: 12 mg/dL (ref 6–23)
CO2: 30 mEq/L (ref 19–32)
Calcium: 9.4 mg/dL (ref 8.4–10.5)
Chloride: 103 mEq/L (ref 96–112)
Creatinine, Ser: 0.73 mg/dL (ref 0.40–1.20)
GFR: 85.26 mL/min (ref >60.00)
Glucose, Bld: 88 mg/dL (ref 70–99)
Potassium: 4.1 mEq/L (ref 3.5–5.1)
Sodium: 140 mEq/L (ref 135–145)

## 2021-06-27 LAB — LIPID PANEL
Cholesterol: 178 mg/dL (ref 0–200)
HDL: 60 mg/dL (ref >39.00)
LDL Cholesterol: 98 mg/dL (ref 0–99)
NonHDL: 117.73
Total CHOL/HDL Ratio: 3
Triglycerides: 101 mg/dL (ref 0.0–149.0)
VLDL: 20.2 mg/dL (ref 0.0–40.0)

## 2021-06-27 LAB — CBC WITH DIFFERENTIAL/PLATELET
Basophils Absolute: 0 10*3/uL (ref 0.0–0.1)
Basophils Relative: 1.2 % (ref 0.0–3.0)
Eosinophils Absolute: 0.1 10*3/uL (ref 0.0–0.7)
Eosinophils Relative: 2.9 % (ref 0.0–5.0)
HCT: 42.7 % (ref 36.0–46.0)
Hemoglobin: 14.4 g/dL (ref 12.0–15.0)
Lymphocytes Relative: 31.1 % (ref 12.0–46.0)
Lymphs Abs: 1.3 10*3/uL (ref 0.7–4.0)
MCHC: 33.7 g/dL (ref 30.0–36.0)
MCV: 88.3 fl (ref 78.0–100.0)
Monocytes Absolute: 0.5 10*3/uL (ref 0.1–1.0)
Monocytes Relative: 11.6 % (ref 3.0–12.0)
Neutro Abs: 2.2 10*3/uL (ref 1.4–7.7)
Neutrophils Relative %: 53.2 % (ref 43.0–77.0)
Platelets: 188 10*3/uL (ref 150.0–400.0)
RBC: 4.84 Mil/uL (ref 3.87–5.11)
RDW: 13.1 % (ref 11.5–15.5)
WBC: 4.1 10*3/uL (ref 4.0–10.5)

## 2021-06-27 LAB — HEPATIC FUNCTION PANEL
ALT: 20 U/L (ref 0–35)
AST: 22 U/L (ref 0–37)
Albumin: 4.5 g/dL (ref 3.5–5.2)
Alkaline Phosphatase: 81 U/L (ref 39–117)
Bilirubin, Direct: 0.1 mg/dL (ref 0.0–0.3)
Total Bilirubin: 0.5 mg/dL (ref 0.2–1.2)
Total Protein: 7.8 g/dL (ref 6.0–8.3)

## 2021-06-27 LAB — VITAMIN D 25 HYDROXY (VIT D DEFICIENCY, FRACTURES): VITD: 53.5 ng/mL (ref 30.00–100.00)

## 2021-06-27 LAB — HEMOGLOBIN A1C: Hgb A1c MFr Bld: 5.5 % (ref 4.6–6.5)

## 2021-06-27 LAB — TSH: TSH: 2.2 u[IU]/mL (ref 0.35–5.50)

## 2021-06-27 MED ORDER — TEMAZEPAM 15 MG PO CAPS
15.0000 mg | ORAL_CAPSULE | Freq: Every evening | ORAL | 5 refills | Status: DC | PRN
Start: 2021-06-27 — End: 2022-01-18

## 2021-06-27 NOTE — Addendum Note (Signed)
Addended by: Wyvonne Lenz on: 06/27/2021 09:54 AM   Modules accepted: Orders

## 2021-06-27 NOTE — Progress Notes (Signed)
   Subjective:    Patient ID: Latasha Marshall, female    DOB: [birth year], 65 y.o.   MRN: [id number]  HPI Here for a well exam. She feels great. She asks me to check a lump on the right index finger that appeared about 3 months ago. This does not bother her at all.    Review of Systems  Constitutional: Negative.   HENT: Negative.    Eyes: Negative.   Respiratory: Negative.    Cardiovascular: Negative.   Gastrointestinal: Negative.   Genitourinary:  Negative for decreased urine volume, difficulty urinating, dyspareunia, dysuria, enuresis, flank pain, frequency, hematuria, pelvic pain and urgency.  Musculoskeletal: Negative.   Skin: Negative.   Neurological: Negative.  Negative for headaches.  Psychiatric/Behavioral: Negative.        Objective:   Physical Exam Constitutional:      General: She is not in acute distress.    Appearance: Normal appearance. She is well-developed.  HENT:     Head: Normocephalic and atraumatic.     Right Ear: External ear normal.     Left Ear: External ear normal.     Nose: Nose normal.     Mouth/Throat:     Pharynx: No oropharyngeal exudate.  Eyes:     General: No scleral icterus.    Conjunctiva/sclera: Conjunctivae normal.     Pupils: Pupils are equal, round, and reactive to light.  Neck:     Thyroid: No thyromegaly.     Vascular: No JVD.  Cardiovascular:     Rate and Rhythm: Normal rate and regular rhythm.     Heart sounds: Normal heart sounds. No murmur heard.   No friction rub. No gallop.  Pulmonary:     Effort: Pulmonary effort is normal. No respiratory distress.     Breath sounds: Normal breath sounds. No wheezing or rales.  Chest:     Chest wall: No tenderness.  Abdominal:     General: Bowel sounds are normal. There is no distension.     Palpations: Abdomen is soft. There is no mass.     Tenderness: There is no abdominal tenderness. There is no guarding or rebound.  Musculoskeletal:        General: No tenderness. Normal range of  motion.     Cervical back: Normal range of motion and neck supple.     Comments: There is a small non-tender ganglion cyst on the dorsal surface of the right index DIP joint   Lymphadenopathy:     Cervical: No cervical adenopathy.  Skin:    General: Skin is warm and dry.     Findings: No erythema or rash.  Neurological:     Mental Status: She is alert and oriented to person, place, and time.     Cranial Nerves: No cranial nerve deficit.     Motor: No abnormal muscle tone.     Coordination: Coordination normal.     Deep Tendon Reflexes: Reflexes are normal and symmetric. Reflexes normal.  Psychiatric:        Behavior: Behavior normal.        Thought Content: Thought content normal.        Judgment: Judgment normal.          Assessment & Plan:  Well exam. We discussed diet and exercise. Get fasting labs. She has a small ganglion cyst on the finger, and we agreed to simply monitor this for now.  Alysia Penna, MD

## 2021-07-04 DIAGNOSIS — Z01419 Encounter for gynecological examination (general) (routine) without abnormal findings: Secondary | ICD-10-CM | POA: Diagnosis not present

## 2021-07-04 DIAGNOSIS — Z6824 Body mass index (BMI) 24.0-24.9, adult: Secondary | ICD-10-CM | POA: Diagnosis not present

## 2021-07-04 DIAGNOSIS — N958 Other specified menopausal and perimenopausal disorders: Secondary | ICD-10-CM | POA: Diagnosis not present

## 2021-07-04 DIAGNOSIS — M816 Localized osteoporosis [Lequesne]: Secondary | ICD-10-CM | POA: Diagnosis not present

## 2021-08-28 ENCOUNTER — Telehealth: Payer: Self-pay | Admitting: Family Medicine

## 2021-08-28 NOTE — Telephone Encounter (Signed)
Left message for patient to call back and schedule Medicare Annual Wellness Visit (AWV) either virtually or in office. Left  my Latasha Marshall number [phone]   awvi 11/08/20 per [facility] please schedule at anytime with [facility] Nurse Health Advisor 1 or 2   This should be a 45 minute visit.

## 2021-09-06 ENCOUNTER — Ambulatory Visit (INDEPENDENT_AMBULATORY_CARE_PROVIDER_SITE_OTHER): Payer: Medicare PPO

## 2021-09-06 VITALS — Ht 64.0 in | Wt 142.0 lb

## 2021-09-06 DIAGNOSIS — Z0000 Encounter for general adult medical examination without abnormal findings: Secondary | ICD-10-CM

## 2021-09-06 NOTE — Patient Instructions (Addendum)
Ms. Latasha Marshall , Thank you for taking time to come for your Medicare Wellness Visit. I appreciate your ongoing commitment to your health goals. Please review the following plan we discussed and let me know if I can assist you in the future.   These are the goals we discussed:  Goals      Maintain lifestyle     Maintain current lifestyle.        This is a list of the screening recommended for you and due dates:  Health Maintenance  Topic Date Due   COVID-19 Vaccine (5 - Booster for Pfizer series) 08/17/2021   Zoster (Shingles) Vaccine (2 of 2) 12/05/2021*   DEXA scan (bone density measurement)  09/06/2022*   Mammogram  10/06/2022   Tetanus Vaccine  02/19/2024   Colon Cancer Screening  06/19/2025   Pneumonia Vaccine  Completed   Flu Shot  Completed   Hepatitis C Screening: USPSTF Recommendation to screen - Ages 18-79 yo.  Completed   HPV Vaccine  Aged Out  *Topic was postponed. The date shown is not the original due date.    Advanced directives: Yes  Conditions/risks identified: None  Next appointment: Follow up in one year for your annual wellness visit    Preventive Care 65 Years and Older, Female Preventive care refers to lifestyle choices and visits with your health care provider that can promote health and wellness. What does preventive care include? A yearly physical exam. This is also called an annual well check. Dental exams once or twice a year. Routine eye exams. Ask your health care provider how often you should have your eyes checked. Personal lifestyle choices, including: Daily care of your teeth and gums. Regular physical activity. Eating a healthy diet. Avoiding tobacco and drug use. Limiting alcohol use. Practicing safe sex. Taking low-dose aspirin every day. Taking vitamin and mineral supplements as recommended by your health care provider. What happens during an annual well check? The services and screenings done by your health care provider during  your annual well check will depend on your age, overall health, lifestyle risk factors, and family history of disease. Counseling  Your health care provider may ask you questions about your: Alcohol use. Tobacco use. Drug use. Emotional well-being. Home and relationship well-being. Sexual activity. Eating habits. History of falls. Memory and ability to understand (cognition). Work and work Statistician. Reproductive health. Screening  You may have the following tests or measurements: Height, weight, and BMI. Blood pressure. Lipid and cholesterol levels. These may be checked every 5 years, or more frequently if you are over 36 years old. Skin check. Lung cancer screening. You may have this screening every year starting at age 67 if you have a 30-pack-year history of smoking and currently smoke or have quit within the past 15 years. Fecal occult blood test (FOBT) of the stool. You may have this test every year starting at age 77. Flexible sigmoidoscopy or colonoscopy. You may have a sigmoidoscopy every 5 years or a colonoscopy every 10 years starting at age 72. Hepatitis C blood test. Hepatitis B blood test. Sexually transmitted disease (STD) testing. Diabetes screening. This is done by checking your blood sugar (glucose) after you have not eaten for a while (fasting). You may have this done every 1-3 years. Bone density scan. This is done to screen for osteoporosis. You may have this done starting at age 62. Mammogram. This may be done every 1-2 years. Talk to your health care provider about how often you should have  regular mammograms. Talk with your health care provider about your test results, treatment options, and if necessary, the need for more tests. Vaccines  Your health care provider may recommend certain vaccines, such as: Influenza vaccine. This is recommended every year. Tetanus, diphtheria, and acellular pertussis (Tdap, Td) vaccine. You may need a Td booster every 10  years. Zoster vaccine. You may need this after age 33. Pneumococcal 13-valent conjugate (PCV13) vaccine. One dose is recommended after age 76. Pneumococcal polysaccharide (PPSV23) vaccine. One dose is recommended after age 11. Talk to your health care provider about which screenings and vaccines you need and how often you need them. This information is not intended to replace advice given to you by your health care provider. Make sure you discuss any questions you have with your health care provider. Document Released: 10/21/2015 Document Revised: 06/13/2016 Document Reviewed: 07/26/2015 Elsevier Interactive Patient Education  2017 [place] Prevention in the Home Falls can cause injuries. They can happen to people of all ages. There are many things you can do to make your home safe and to help prevent falls. What can I do on the outside of my home? Regularly fix the edges of walkways and driveways and fix any cracks. Remove anything that might make you trip as you walk through a door, such as a raised step or threshold. Trim any bushes or trees on the path to your home. Use bright outdoor lighting. Clear any walking paths of anything that might make someone trip, such as rocks or tools. Regularly check to see if handrails are loose or broken. Make sure that both sides of any steps have handrails. Any raised decks and porches should have guardrails on the edges. Have any leaves, snow, or ice cleared regularly. Use sand or salt on walking paths during winter. Clean up any spills in your garage right away. This includes oil or grease spills. What can I do in the bathroom? Use night lights. Install grab bars by the toilet and in the tub and shower. Do not use towel bars as grab bars. Use non-skid mats or decals in the tub or shower. If you need to sit down in the shower, use a plastic, non-slip stool. Keep the floor dry. Clean up any water that spills on the floor as soon as it  happens. Remove soap buildup in the tub or shower regularly. Attach bath mats securely with double-sided non-slip rug tape. Do not have throw rugs and other things on the floor that can make you trip. What can I do in the bedroom? Use night lights. Make sure that you have a light by your bed that is easy to reach. Do not use any sheets or blankets that are too big for your bed. They should not hang down onto the floor. Have a firm chair that has side arms. You can use this for support while you get dressed. Do not have throw rugs and other things on the floor that can make you trip. What can I do in the kitchen? Clean up any spills right away. Avoid walking on wet floors. Keep items that you use a lot in easy-to-reach places. If you need to reach something above you, use a strong step stool that has a grab bar. Keep electrical cords out of the way. Do not use floor polish or wax that makes floors slippery. If you must use wax, use non-skid floor wax. Do not have throw rugs and other things on the floor that  can make you trip. What can I do with my stairs? Do not leave any items on the stairs. Make sure that there are handrails on both sides of the stairs and use them. Fix handrails that are broken or loose. Make sure that handrails are as long as the stairways. Check any carpeting to make sure that it is firmly attached to the stairs. Fix any carpet that is loose or worn. Avoid having throw rugs at the top or bottom of the stairs. If you do have throw rugs, attach them to the floor with carpet tape. Make sure that you have a light switch at the top of the stairs and the bottom of the stairs. If you do not have them, ask someone to add them for you. What else can I do to help prevent falls? Wear shoes that: Do not have high heels. Have rubber bottoms. Are comfortable and fit you well. Are closed at the toe. Do not wear sandals. If you use a stepladder: Make sure that it is fully opened.  Do not climb a closed stepladder. Make sure that both sides of the stepladder are locked into place. Ask someone to hold it for you, if possible. Clearly mark and make sure that you can see: Any grab bars or handrails. First and last steps. Where the edge of each step is. Use tools that help you move around (mobility aids) if they are needed. These include: Canes. Walkers. Scooters. Crutches. Turn on the lights when you go into a dark area. Replace any light bulbs as soon as they burn out. Set up your furniture so you have a clear path. Avoid moving your furniture around. If any of your floors are uneven, fix them. If there are any pets around you, be aware of where they are. Review your medicines with your doctor. Some medicines can make you feel dizzy. This can increase your chance of falling. Ask your doctor what other things that you can do to help prevent falls. This information is not intended to replace advice given to you by your health care provider. Make sure you discuss any questions you have with your health care provider. Document Released: 07/21/2009 Document Revised: 03/01/2016 Document Reviewed: 10/29/2014 Elsevier Interactive Patient Education  2017 [employer].

## 2021-09-06 NOTE — Progress Notes (Signed)
Subjective:   Latasha Marshall is a 65 y.o. female who presents for Medicare Annual (Subsequent) preventive examination.  Review of Systems    No ROS      Objective:    Today's Vitals   09/06/21 1526  Weight: 142 lb (64.4 kg)  Height: 5\' 4"  (1.626 m)   Body mass index is 24.37 kg/m.  Advanced Directives 05/30/2015  Does Patient Have a Medical Advance Directive? Yes  Type of Advance Directive Healthcare Power of Attorney    Current Medications (verified) Outpatient Encounter Medications as of 09/06/2021  Medication Sig   b complex vitamins tablet Take 1 tablet by mouth daily.   fish oil-omega-3 fatty acids 1000 MG capsule Take 2 g by mouth daily.     metroNIDAZOLE (METROCREAM) 0.75 % cream Apply topically daily.   Multiple Vitamin (MULTIVITAMIN) tablet Take 1 tablet by mouth daily.     temazepam (RESTORIL) 15 MG capsule Take 1 capsule (15 mg total) by mouth at bedtime as needed.   No facility-administered encounter medications on file as of 09/06/2021.    Allergies (verified) Patient has no known allergies.   History: Past Medical History:  Diagnosis Date   Heart murmur    as a child, resolved   Insomnia    Osteoporosis    dexa on 2010   Past Surgical History:  Procedure Laterality Date   COLONOSCOPY  06/20/2015   per Dr. Henrene Pastor, hyperplastic polyps, repeat in 10 yrs    OVARIAN CYST REMOVAL  1981   POLYPECTOMY     Family History  Problem Relation Age of Onset   Stroke Other    Hypertension Other    Colon cancer Neg Hx    Social History   Socioeconomic History   Marital status: Divorced    Spouse name: Not on file   Number of children: Not on file   Years of education: Not on file   Highest education level: Not on file  Occupational History   Not on file  Tobacco Use   Smoking status: Never   Smokeless tobacco: Never  Vaping Use   Vaping Use: Never used  Substance and Sexual Activity   Alcohol use: Yes    Alcohol/week: 21.0 standard drinks     Types: 7 Standard drinks or equivalent, 14 Glasses of wine per week    Comment: glass of wine daily   Drug use: No   Sexual activity: Not on file  Other Topics Concern   Not on file  Social History Narrative   Married   Regular exercise- yes   Social Determinants of Health   Financial Resource Strain: Low Risk    Difficulty of Paying Living Expenses: Not hard at all  Food Insecurity: No Food Insecurity   Worried About Charity fundraiser in the Last Year: Never true   Arboriculturist in the Last Year: Never true  Transportation Needs: No Transportation Needs   Lack of Transportation (Medical): No   Lack of Transportation (Non-Medical): No  Physical Activity: Sufficiently Active   Days of Exercise per Week: 3 days   Minutes of Exercise per Session: 60 min  Stress: No Stress Concern Present   Feeling of Stress : Not at all  Social Connections: Moderately Integrated   Frequency of Communication with Friends and Family: More than three times a week   Frequency of Social Gatherings with Friends and Family: More than three times a week   Attends Religious Services: More than 4  times per year   Active Member of Clubs or Organizations: Yes   Attends Archivist Meetings: More than 4 times per year   Marital Status: Divorced    Clinical Intake:  How often do you need to have someone help you when you read instructions, pamphlets, or other written materials from your doctor or pharmacy?: 1 - Never  Diabetic? No  Interpreter Needed?: NoActivities of Daily Living In your present state of health, do you have any difficulty performing the following activities: 09/06/2021 09/06/2021  Hearing? N N  Vision? N N  Comment Wears glasses. -  Difficulty concentrating or making decisions? N N  Walking or climbing stairs? N N  Dressing or bathing? N N  Doing errands, shopping? N N  Preparing Food and eating ? N N  Using the Toilet? N N  In the past six months, have you  accidently leaked urine? N N  Do you have problems with loss of bowel control? N N  Managing your Medications? N N  Managing your Finances? N N  Housekeeping or managing your Housekeeping? N N  Some recent data might be hidden    Patient Care Team: Laurey Morale, MD as PCP - [facility] [place], Ronalee Red, PA-C as Physician Assistant (Dermatology)  Indicate any recent Medical Services you may have received from other than [facility] providers in the past year (date may be approximate).     Assessment:   This is a routine wellness examination for [place].Virtual Visit via Telephone Note  I connected with  [name] on 09/06/21 at  3:15 PM EST by telephone and verified that I am speaking with the correct person using two identifiers.  Location: Patient: Home Provider: Office Persons participating in the virtual visit: patient/Nurse Health Advisor   I discussed the limitations, risks, security and privacy concerns of performing an evaluation and management service by telephone and the availability of in person appointments. The patient expressed understanding and agreed to proceed.  Interactive audio and video telecommunications were attempted between this nurse and patient, however failed, due to patient having technical difficulties OR patient did not have access to video capability.  We continued and completed visit with audio only.  Some vital signs may be absent or patient reported.   Criselda Peaches, LPN   Hearing/Vision screen Hearing Screening - Comments:: No difficulty hearing Vision Screening - Comments:: Wears glasses. Followed by [facility]  Dietary issues and exercise activities discussed: Current Exercise Habits: Structured exercise class, Type of exercise: stretching;walking;strength training/weights, Time (Minutes): 60, Frequency (Times/Week): 3, Weekly Exercise (Minutes/Week): 180, Intensity: Moderate   Goals Addressed             This Visit's  Progress    Maintain lifestyle       Maintain current lifestyle.       Depression Screen PHQ 2/9 Scores 09/06/2021  PHQ - 2 Score 0    Fall Risk Fall Risk  09/06/2021 09/06/2021 09/02/2021 06/27/2021  Falls in the past year? 0 0 0 0  Number falls in past yr: 0 0 0 0  Injury with Fall? 0 0 0 0  Risk for fall due to : - - - No Fall Risks    FALL RISK PREVENTION PERTAINING TO THE HOME:  Any stairs in or around the home? No  If so, are there any without handrails? No  Home free of loose throw rugs in walkways, pet beds, electrical cords, etc? Yes Adequate lighting in your  home to reduce risk of falls? Yes   ASSISTIVE DEVICES UTILIZED TO PREVENT FALLS:  Life alert? No  Use of a cane, walker or w/c? No  Grab bars in the bathroom? No  Shower chair or bench in shower? No  Elevated toilet seat or a handicapped toilet? No   TIMED UP AND GO:  Was the test performed? No . Audio Visit  Cognitive Function:     6CIT Screen 09/06/2021  What Year? 0 points  What month? 0 points  What time? 0 points  Count back from 20 0 points  Months in reverse 0 points  Repeat phrase 0 points  Total Score 0    Immunizations Immunization History  Administered Date(s) Administered   Fluad Quad(high Dose 65+) 06/27/2021   Hep A / Hep B 02/18/2014, 08/10/2014   Hepatitis B, adult 05/03/2014   Influenza Split 07/22/2012   PFIZER Comirnaty(Gray Top)Covid-19 Tri-Sucrose Vaccine 11/12/2019, 12/03/2019, 07/04/2020, 06/22/2021   PFIZER(Purple Top)SARS-COV-2 Vaccination 06/22/2021   PPD Test 06/08/2020   Pneumococcal Conjugate-13 05/15/2018   Pneumococcal Polysaccharide-23 05/26/2019   Tdap 02/18/2014   Zoster Recombinat (Shingrix) 12/23/2018   Zoster, Live 05/31/2011    Qualifies for Shingles Vaccine? Yes   Zostavax completed Yes   Shingrix Completed?: Yes  Screening Tests Health Maintenance  Topic Date Due   COVID-19 Vaccine (5 - Booster for Pfizer series) 08/17/2021   Zoster  Vaccines- Shingrix (2 of 2) 12/05/2021 (Originally 02/17/2019)   DEXA SCAN  09/06/2022 (Originally 05/03/2019)   MAMMOGRAM  10/06/2022   TETANUS/TDAP  02/19/2024   COLONOSCOPY (Pts 45-13yrs Insurance coverage will need to be confirmed)  06/19/2025   Pneumonia Vaccine 67+ Years old  Completed   INFLUENZA VACCINE  Completed   Hepatitis C Screening  Completed   HPV VACCINES  Aged Out    Health Maintenance  Health Maintenance Due  Topic Date Due   COVID-19 Vaccine (5 - Booster for [place] series) 08/17/2021    Vision Screening: Recommended annual ophthalmology exams for early detection of glaucoma and other disorders of the eye. Is the patient up to date with their annual eye exam?  Yes  Who is the provider or what is the name of the office in which the patient attends annual eye exams? Followed by [place] Screening: Recommended annual dental exams for proper oral hygiene  Community Resource Referral / Chronic Care Management:  CRR required this visit?  No   CCM required this visit?  No      Plan:     I have personally reviewed and noted the following in the patient's chart:   Medical and social history Use of alcohol, tobacco or illicit drugs  Current medications and supplements including opioid prescriptions. Not currently taking opioids Functional ability and status Nutritional status Physical activity Advanced directives List of other physicians Hospitalizations, surgeries, and ER visits in previous 12 months Vitals Screenings to include cognitive, depression, and falls Referrals and appointments  In addition, I have reviewed and discussed with patient certain preventive protocols, quality metrics, and best practice recommendations. A written personalized care plan for preventive services as well as general preventive health recommendations were provided to patient.     Criselda Peaches, LPN   [id number]

## 2021-09-08 DIAGNOSIS — Z1231 Encounter for screening mammogram for malignant neoplasm of breast: Secondary | ICD-10-CM | POA: Diagnosis not present

## 2022-01-17 ENCOUNTER — Other Ambulatory Visit: Payer: Self-pay | Admitting: Family Medicine

## 2022-01-17 NOTE — Telephone Encounter (Signed)
Last OV physical- 06/27/2021 ?Last refill- 06/27/2021-30 capsules, 5 refills ? ?No future OV scheduled. Can this patient receive a refill? ?

## 2022-02-21 ENCOUNTER — Telehealth: Payer: Medicare PPO | Admitting: Physician Assistant

## 2022-02-21 DIAGNOSIS — U071 COVID-19: Secondary | ICD-10-CM

## 2022-02-21 MED ORDER — MOLNUPIRAVIR EUA 200MG CAPSULE: 4.0000 | ORAL_CAPSULE | Freq: Two times a day (BID) | ORAL | 0 refills | Status: AC

## 2022-02-21 NOTE — Patient Instructions (Signed)
?[name], thank you for joining Leeanne Rio, PA-C for today's virtual visit.  While this provider is not your primary care provider (PCP), if your PCP is located in our provider database this encounter information will be shared with them immediately following your visit. ? ?Consent: ?(Patient) Latasha Marshall provided verbal consent for this virtual visit at the beginning of the encounter. ? ?Current Medications: ? ?Current Outpatient Medications:  ?  b complex vitamins tablet, Take 1 tablet by mouth daily., Disp: , Rfl:  ?  fish oil-omega-3 fatty acids 1000 MG capsule, Take 2 g by mouth daily.  , Disp: , Rfl:  ?  metroNIDAZOLE (METROCREAM) 0.75 % cream, Apply topically daily., Disp: 45 g, Rfl: 11 ?  Multiple Vitamin (MULTIVITAMIN) tablet, Take 1 tablet by mouth daily.  , Disp: , Rfl:  ?  temazepam (RESTORIL) 15 MG capsule, TAKE 1 CAPSULE (15 MG TOTAL) BY MOUTH AT BEDTIME AS NEEDED., Disp: 30 capsule, Rfl: 5  ? ?Medications ordered in this encounter:  ?No orders of the defined types were placed in this encounter. ?  ? ?*If you need refills on other medications prior to your next appointment, please contact your pharmacy* ? ?Follow-Up: ?Call back or seek an in-person evaluation if the symptoms worsen or if the condition fails to improve as anticipated. ? ?Other Instructions ?Please keep well-hydrated and get plenty of rest. ?Start a saline nasal rinse to flush out your nasal passages. ?You can use plain Mucinex to help thin congestion. ?If you have a humidifier, running in the bedroom at night. ?I want you to start OTC vitamin D3 1000 units daily, vitamin C 1000 mg daily, and a zinc supplement. ?Please take prescribed medications as directed. ? ?You have been enrolled in a MyChart symptom monitoring program. ?Please answer these questions daily so we can keep track of how you are doing. ? ?You were to quarantine for 5 days from onset of your symptoms.  After day 5, if you have had no fever and  you are feeling better, you can end quarantine but need to mask for an additional 5 days. ?After day 5 if you have a fever or are having significant symptoms, please quarantine for full 10 days. ? ?If you note any worsening of symptoms, any significant shortness of breath or any chest pain, please seek ER evaluation ASAP.  Please do not delay care! ? ?COVID-19: What to Do if You Are Sick ?If you test positive and are an older adult or someone who is at high risk of getting very sick from COVID-19, treatment may be available. Contact a healthcare provider right away after a positive test to determine if you are eligible, even if your symptoms are mild right now. You can also visit a Test to Treat location and, if eligible, receive a prescription from a provider. Don't delay: Treatment must be started within the first few days to be effective. ?If you have a fever, cough, or other symptoms, you might have COVID-19. Most people have mild illness and are able to recover at home. If you are sick: ?Keep track of your symptoms. ?If you have an emergency warning sign (including trouble breathing), call 911. ?Steps to help prevent the spread of COVID-19 if you are sick ?If you are sick with COVID-19 or think you might have COVID-19, follow the steps below to care for yourself and to help protect other people in your home and community. ?Stay home except to get medical care ?Stay home.  Most people with COVID-19 have mild illness and can recover at home without medical care. Do not leave your home, except to get medical care. Do not visit public areas and do not go to places where you are unable to wear a mask. ?Take care of yourself. Get rest and stay hydrated. Take over-the-counter medicines, such as acetaminophen, to help you feel better. ?Stay in touch with your doctor. Call before you get medical care. Be sure to get care if you have trouble breathing, or have any other emergency warning signs, or if you think it is an  emergency. ?Avoid public transportation, ride-sharing, or taxis if possible. ?Get tested ?If you have symptoms of COVID-19, get tested. While waiting for test results, stay away from others, including staying apart from those living in your household. ?Get tested as soon as possible after your symptoms start. Treatments may be available for people with COVID-19 who are at risk for becoming very sick. Don't delay: Treatment must be started early to be effective--some treatments must begin within 5 days of your first symptoms. Contact your healthcare provider right away if your test result is positive to determine if you are eligible. ?Self-tests are one of several options for testing for the virus that causes COVID-19 and may be more convenient than laboratory-based tests and point-of-care tests. Ask your healthcare provider or your local health department if you need help interpreting your test results. ?You can visit your state, tribal, local, and territorial health department's website to look for the latest local information on testing sites. ?Separate yourself from other people ?As much as possible, stay in a specific room and away from other people and pets in your home. If possible, you should use a separate bathroom. If you need to be around other people or animals in or outside of the home, wear a well-fitting mask. ?Tell your close contacts that they may have been exposed to COVID-19. An infected person can spread COVID-19 starting 48 hours (or 2 days) before the person has any symptoms or tests positive. By letting your close contacts know they may have been exposed to COVID-19, you are helping to protect everyone. ?See COVID-19 and Animals if you have questions about pets. ?If you are diagnosed with COVID-19, someone from the health department may call you. Answer the call to slow the spread. ?Monitor your symptoms ?Symptoms of COVID-19 include fever, cough, or other symptoms. ?Follow care instructions  from your healthcare provider and local health department. Your local health authorities may give instructions on checking your symptoms and reporting information. ?When to seek emergency medical attention ?Look for emergency warning signs* for COVID-19. If someone is showing any of these signs, seek emergency medical care immediately: ?Trouble breathing ?Persistent pain or pressure in the chest ?New confusion ?Inability to wake or stay awake ?Pale, gray, or blue-colored skin, lips, or nail beds, depending on skin tone ?*This list is not all possible symptoms. Please call your medical provider for any other symptoms that are severe or concerning to you. ?Call 911 or call ahead to your local emergency facility: Notify the operator that you are seeking care for someone who has or may have COVID-19. ?Call ahead before visiting your doctor ?Call ahead. Many medical visits for routine care are being postponed or done by phone or telemedicine. ?If you have a medical appointment that cannot be postponed, call your doctor's office, and tell them you have or may have COVID-19. This will help the office protect themselves and other patients. ?  If you are sick, wear a well-fitting mask ?You should wear a mask if you must be around other people or animals, including pets (even at home). ?Wear a mask with the best fit, protection, and comfort for you. ?You don't need to wear the mask if you are alone. If you can't put on a mask (because of trouble breathing, for example), cover your coughs and sneezes in some other way. Try to stay at least 6 feet away from other people. This will help protect the people around you. ?Masks should not be placed on young children under age 44 years, anyone who has trouble breathing, or anyone who is not able to remove the mask without help. ?Cover your coughs and sneezes ?Cover your mouth and nose with a tissue when you cough or sneeze. ?Throw away used tissues in a lined trash can. ?Immediately  wash your hands with soap and water for at least 20 seconds. If soap and water are not available, clean your hands with an alcohol-based hand sanitizer that contains at least 60% alcohol. ?Clean your hands of

## 2022-02-21 NOTE — Progress Notes (Signed)
?Virtual Visit Consent  ? ?Latasha Marshall, you are scheduled for a virtual visit with a [facility] provider today. Just as with appointments in the office, your consent must be obtained to participate. Your consent will be active for this visit and any virtual visit you may have with one of our providers in the next 365 days. If you have a MyChart account, a copy of this consent can be sent to you electronically. ? ?As this is a virtual visit, video technology does not allow for your provider to perform a traditional examination. This may limit your provider's ability to fully assess your condition. If your provider identifies any concerns that need to be evaluated in person or the need to arrange testing (such as labs, EKG, etc.), we will make arrangements to do so. Although advances in technology are sophisticated, we cannot ensure that it will always work on either your end or our end. If the connection with a video visit is poor, the visit may have to be switched to a telephone visit. With either a video or telephone visit, we are not always able to ensure that we have a secure connection. ? ?By engaging in this virtual visit, you consent to the provision of healthcare and authorize for your insurance to be billed (if applicable) for the services provided during this visit. Depending on your insurance coverage, you may receive a charge related to this service. ? ?I need to obtain your verbal consent now. Are you willing to proceed with your visit today? Latasha Marshall has provided verbal consent on 02/21/2022 for a virtual visit (video or telephone). Leeanne Rio, PA-C ? ?Date: 02/21/2022 7:59 AM ? ?Virtual Visit via Video Note  ? ?ILeeanne Rio, connected with  Latasha Marshall  ([id number], 1956-06-14) on 02/21/22 at  8:15 AM EDT by a video-enabled telemedicine application and verified that I am speaking with the correct person using two identifiers. ? ?Location: ?Patient: Virtual Visit  Location Patient: Home ?Provider: Virtual Visit Location Provider: Home Office ?  ?I discussed the limitations of evaluation and management by telemedicine and the availability of in person appointments. The patient expressed understanding and agreed to proceed.   ? ?History of Present Illness: ?Latasha Marshall is a 66 y.o. who identifies as a female who was assigned female at birth, and is being seen today for COVID-19. Recently went on a trip to the [place] and just returned via long flights. Noted a mild scratchy throat starting Sunday. Initially thought was due to jet lag and dehydration. Since last night, now with fatigue, continued scratchy throat, aches, chills. COVID test this morning which was positive. Low-grade fever today.  Denies any chest pain, chest congestion, SOB or any GI symptoms. This is her first time with known COVID-19. ? ?HPI: HPI  ?Problems:  ?Patient Active Problem List  ? Diagnosis Date Noted  ? DEPRESSION 08/18/2010  ? LIPOMA 07/26/2009  ? VITAMIN D DEFICIENCY 12/09/2008  ? 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 LOW BACK PAIN SYNDROME 05/12/2007  ? Disorder of bone and cartilage 05/12/2007  ?  ?Allergies: No Known Allergies ?Medications:  ?Current Outpatient Medications:  ?  b complex vitamins tablet, Take 1 tablet by mouth daily., Disp: , Rfl:  ?  fish oil-omega-3 fatty acids 1000 MG capsule, Take 2 g by mouth daily.  , Disp: , Rfl:  ?  ibandronate (BONIVA) 150 MG tablet, Take by mouth., Disp: , Rfl:  ?  metroNIDAZOLE (METROCREAM) 0.75 %  cream, Apply topically daily., Disp: 45 g, Rfl: 11 ?  Multiple Vitamin (MULTIVITAMIN) tablet, Take 1 tablet by mouth daily.  , Disp: , Rfl:  ?  temazepam (RESTORIL) 15 MG capsule, TAKE 1 CAPSULE (15 MG TOTAL) BY MOUTH AT BEDTIME AS NEEDED., Disp: 30 capsule, Rfl: 5 ? ?Observations/Objective: ?Patient is well-developed, well-nourished in no acute distress.  ?Resting comfortably at home.  ?Head is normocephalic, atraumatic.  ?No labored  breathing. ?Speech is clear and coherent with logical content.  ?Patient is alert and oriented at baseline.  ? ?Assessment and Plan: ?1. COVID-19 ? ?Patient with multiple risk factors for complicated course of illness. Discussed risks/benefits of antiviral medications including most common potential ADRs. Patient voiced understanding and would like to proceed with antiviral medication. They are candidate for molnupiravir. Rx sent to pharmacy. Supportive measures, OTC medications and vitamin regimen reviewed. Patient has been enrolled in a MyChart COVID symptom monitoring program. Samule Dry reviewed in detail. Strict ER precautions discussed with patient.  ? ? ?Follow Up Instructions: ?I discussed the assessment and treatment plan with the patient. The patient was provided an opportunity to ask questions and all were answered. The patient agreed with the plan and demonstrated an understanding of the instructions.  A copy of instructions were sent to the patient via MyChart unless otherwise noted below.  ? ?The patient was advised to call back or seek an in-person evaluation if the symptoms worsen or if the condition fails to improve as anticipated. ? ?Time:  ?I spent 10 minutes with the patient via telehealth technology discussing the above problems/concerns.   ? ?Leeanne Rio, PA-C ?

## 2022-02-26 ENCOUNTER — Encounter (INDEPENDENT_AMBULATORY_CARE_PROVIDER_SITE_OTHER): Payer: Self-pay

## 2022-02-26 ENCOUNTER — Other Ambulatory Visit: Payer: Self-pay | Admitting: Obstetrics and Gynecology

## 2022-02-26 DIAGNOSIS — Z803 Family history of malignant neoplasm of breast: Secondary | ICD-10-CM

## 2022-03-14 ENCOUNTER — Ambulatory Visit
Admission: RE | Admit: 2022-03-14 | Discharge: 2022-03-14 | Disposition: A | Discharge status: Home / Self Care | Payer: Medicare PPO | Source: Ambulatory Visit | Attending: Obstetrics and Gynecology | Admitting: Obstetrics and Gynecology

## 2022-03-14 DIAGNOSIS — N6489 Other specified disorders of breast: Secondary | ICD-10-CM | POA: Diagnosis not present

## 2022-03-14 DIAGNOSIS — Z803 Family history of malignant neoplasm of breast: Secondary | ICD-10-CM

## 2022-03-14 MED ORDER — GADOBUTROL 1 MMOL/ML IV SOLN
6.0000 mL | Freq: Once | INTRAVENOUS | Status: AC | PRN
  Administered 2022-03-14: 6 mL via INTRAVENOUS

## 2022-06-21 ENCOUNTER — Ambulatory Visit: Payer: Medicare PPO | Admitting: Physician Assistant

## 2022-07-09 DIAGNOSIS — Z124 Encounter for screening for malignant neoplasm of cervix: Secondary | ICD-10-CM | POA: Diagnosis not present

## 2022-07-09 DIAGNOSIS — Z1151 Encounter for screening for human papillomavirus (HPV): Secondary | ICD-10-CM | POA: Diagnosis not present

## 2022-07-09 DIAGNOSIS — Z6824 Body mass index (BMI) 24.0-24.9, adult: Secondary | ICD-10-CM | POA: Diagnosis not present

## 2022-07-09 DIAGNOSIS — M810 Age-related osteoporosis without current pathological fracture: Secondary | ICD-10-CM | POA: Diagnosis not present

## 2022-08-11 ENCOUNTER — Other Ambulatory Visit: Payer: Self-pay | Admitting: Family Medicine

## 2022-08-13 NOTE — Telephone Encounter (Addendum)
Last refill-01/18/22 Last OV-06/27/21  Next OV-09/19/22*

## 2022-09-18 ENCOUNTER — Encounter: Payer: Medicare PPO | Admitting: Family Medicine

## 2022-09-19 ENCOUNTER — Ambulatory Visit (INDEPENDENT_AMBULATORY_CARE_PROVIDER_SITE_OTHER): Payer: Medicare PPO | Admitting: Family Medicine

## 2022-09-19 ENCOUNTER — Encounter: Payer: Self-pay | Admitting: Family Medicine

## 2022-09-19 VITALS — BP 110/64 | HR 79 | Temp 98.7°F | Ht 63.0 in | Wt 142.0 lb

## 2022-09-19 DIAGNOSIS — F5101 Primary insomnia: Secondary | ICD-10-CM | POA: Diagnosis not present

## 2022-09-19 DIAGNOSIS — R739 Hyperglycemia, unspecified: Secondary | ICD-10-CM

## 2022-09-19 DIAGNOSIS — E785 Hyperlipidemia, unspecified: Secondary | ICD-10-CM

## 2022-09-19 DIAGNOSIS — E559 Vitamin D deficiency, unspecified: Secondary | ICD-10-CM | POA: Diagnosis not present

## 2022-09-19 DIAGNOSIS — R7989 Other specified abnormal findings of blood chemistry: Secondary | ICD-10-CM

## 2022-09-19 DIAGNOSIS — G4700 Insomnia, unspecified: Secondary | ICD-10-CM | POA: Insufficient documentation

## 2022-09-19 NOTE — Progress Notes (Signed)
Subjective:    Patient ID: Latasha Marshall, female    DOB: June 26, 1956, 66 y.o.   MRN: [id number]  HPI Here to follow up on issues. She feels well in general. She is treated for osteoporosis by her GYN, Dr. Kathryne Eriksson and she gets DEXA scans every 2 years. She has a hx of low back pain, but this rarely bothers her since she does yoga and stretches daily. She sleeps well most of the time, though she does take a Temazepam several nights a week.    Review of Systems  Constitutional: Negative.   HENT: Negative.    Eyes: Negative.   Respiratory: Negative.    Cardiovascular: Negative.   Gastrointestinal: Negative.   Genitourinary:  Negative for decreased urine volume, difficulty urinating, dyspareunia, dysuria, enuresis, flank pain, frequency, hematuria, pelvic pain and urgency.  Musculoskeletal: Negative.   Skin: Negative.   Neurological: Negative.  Negative for headaches.  Psychiatric/Behavioral: Negative.         Objective:   Physical Exam Constitutional:      General: She is not in acute distress.    Appearance: Normal appearance. She is well-developed.  HENT:     Head: Normocephalic and atraumatic.     Right Ear: External ear normal.     Left Ear: External ear normal.     Nose: Nose normal.     Mouth/Throat:     Pharynx: No oropharyngeal exudate.  Eyes:     General: No scleral icterus.    Conjunctiva/sclera: Conjunctivae normal.     Pupils: Pupils are equal, round, and reactive to light.  Neck:     Thyroid: No thyromegaly.     Vascular: No JVD.  Cardiovascular:     Rate and Rhythm: Normal rate and regular rhythm.     Heart sounds: Normal heart sounds. No murmur heard.    No friction rub. No gallop.  Pulmonary:     Effort: Pulmonary effort is normal. No respiratory distress.     Breath sounds: Normal breath sounds. No wheezing or rales.  Chest:     Chest wall: No tenderness.  Abdominal:     General: Bowel sounds are normal. There is no distension.      Palpations: Abdomen is soft. There is no mass.     Tenderness: There is no abdominal tenderness. There is no guarding or rebound.  Musculoskeletal:        General: No tenderness. Normal range of motion.     Cervical back: Normal range of motion and neck supple.  Lymphadenopathy:     Cervical: No cervical adenopathy.  Skin:    General: Skin is warm and dry.     Findings: No erythema or rash.  Neurological:     Mental Status: She is alert and oriented to person, place, and time.     Cranial Nerves: No cranial nerve deficit.     Motor: No abnormal muscle tone.     Coordination: Coordination normal.     Deep Tendon Reflexes: Reflexes are normal and symmetric. Reflexes normal.  Psychiatric:        Behavior: Behavior normal.        Thought Content: Thought content normal.        Judgment: Judgment normal.           Assessment & Plan:  She is doing well. Her osteoporosis is being treated with Boniva. Her low back pain and insomia are stable. We will get fasting labs to check lipids, vitamin D, etc.  We spent a total of ( 32  ) minutes reviewing records and discussing these issues.  Alysia Penna, MD

## 2022-09-24 ENCOUNTER — Other Ambulatory Visit (INDEPENDENT_AMBULATORY_CARE_PROVIDER_SITE_OTHER): Payer: Medicare PPO

## 2022-09-24 DIAGNOSIS — E785 Hyperlipidemia, unspecified: Secondary | ICD-10-CM

## 2022-09-24 DIAGNOSIS — R739 Hyperglycemia, unspecified: Secondary | ICD-10-CM | POA: Diagnosis not present

## 2022-09-24 DIAGNOSIS — E559 Vitamin D deficiency, unspecified: Secondary | ICD-10-CM

## 2022-09-24 LAB — LIPID PANEL
Cholesterol: 164 mg/dL (ref 0–200)
HDL: 55.7 mg/dL (ref >39.00)
NonHDL: 108.02
Total CHOL/HDL Ratio: 3
Triglycerides: 213 mg/dL — ABNORMAL HIGH (ref 0.0–149.0)
VLDL: 42.6 mg/dL — ABNORMAL HIGH (ref 0.0–40.0)

## 2022-09-24 LAB — CBC WITH DIFFERENTIAL/PLATELET
Basophils Absolute: 0 10*3/uL (ref 0.0–0.1)
Basophils Relative: 0.9 % (ref 0.0–3.0)
Eosinophils Absolute: 0.1 10*3/uL (ref 0.0–0.7)
Eosinophils Relative: 2.5 % (ref 0.0–5.0)
HCT: 42.1 % (ref 36.0–46.0)
Hemoglobin: 14.3 g/dL (ref 12.0–15.0)
Lymphocytes Relative: 32.9 % (ref 12.0–46.0)
Lymphs Abs: 1.7 10*3/uL (ref 0.7–4.0)
MCHC: 33.9 g/dL (ref 30.0–36.0)
MCV: 88.7 fl (ref 78.0–100.0)
Monocytes Absolute: 0.5 10*3/uL (ref 0.1–1.0)
Monocytes Relative: 9.7 % (ref 3.0–12.0)
Neutro Abs: 2.8 10*3/uL (ref 1.4–7.7)
Neutrophils Relative %: 54 % (ref 43.0–77.0)
Platelets: 195 10*3/uL (ref 150.0–400.0)
RBC: 4.75 Mil/uL (ref 3.87–5.11)
RDW: 12.4 % (ref 11.5–15.5)
WBC: 5.2 10*3/uL (ref 4.0–10.5)

## 2022-09-24 LAB — HEPATIC FUNCTION PANEL
ALT: 12 U/L (ref 0–35)
AST: 17 U/L (ref 0–37)
Albumin: 4.1 g/dL (ref 3.5–5.2)
Alkaline Phosphatase: 60 U/L (ref 39–117)
Bilirubin, Direct: 0.1 mg/dL (ref 0.0–0.3)
Total Bilirubin: 0.5 mg/dL (ref 0.2–1.2)
Total Protein: 6.7 g/dL (ref 6.0–8.3)

## 2022-09-24 LAB — BASIC METABOLIC PANEL
BUN: 12 mg/dL (ref 6–23)
CO2: 28 mEq/L (ref 19–32)
Calcium: 9.1 mg/dL (ref 8.4–10.5)
Chloride: 104 mEq/L (ref 96–112)
Creatinine, Ser: 0.89 mg/dL (ref 0.40–1.20)
GFR: 66.63 mL/min (ref >60.00)
Glucose, Bld: 90 mg/dL (ref 70–99)
Potassium: 4.2 mEq/L (ref 3.5–5.1)
Sodium: 140 mEq/L (ref 135–145)

## 2022-09-24 LAB — VITAMIN D 25 HYDROXY (VIT D DEFICIENCY, FRACTURES): VITD: 67.24 ng/mL (ref 30.00–100.00)

## 2022-09-24 LAB — LDL CHOLESTEROL, DIRECT: Direct LDL: 90 mg/dL

## 2022-09-24 LAB — HEMOGLOBIN A1C: Hgb A1c MFr Bld: 5.5 % (ref 4.6–6.5)

## 2022-09-24 LAB — TSH: TSH: 7.45 u[IU]/mL — ABNORMAL HIGH (ref 0.35–5.50)

## 2022-09-25 DIAGNOSIS — Z1231 Encounter for screening mammogram for malignant neoplasm of breast: Secondary | ICD-10-CM | POA: Diagnosis not present

## 2022-09-25 NOTE — Addendum Note (Signed)
Addended by: Alysia Penna A on: 09/25/2022 12:41 PM   Modules accepted: Orders

## 2022-09-26 ENCOUNTER — Telehealth: Payer: Self-pay | Admitting: Family Medicine

## 2022-09-26 NOTE — Telephone Encounter (Signed)
Pt called, returning CMA's call. CMA was unavailable. Pt asked that CMA call back at their earliest convenience. 

## 2022-09-28 NOTE — Telephone Encounter (Signed)
Spoke with patient on 09/26/22 concerning lab results.

## 2022-10-24 ENCOUNTER — Other Ambulatory Visit: Payer: Medicare PPO

## 2022-10-25 ENCOUNTER — Other Ambulatory Visit (INDEPENDENT_AMBULATORY_CARE_PROVIDER_SITE_OTHER): Payer: Medicare PPO

## 2022-10-25 DIAGNOSIS — R7989 Other specified abnormal findings of blood chemistry: Secondary | ICD-10-CM | POA: Diagnosis not present

## 2022-10-25 LAB — T4, FREE: Free T4: 0.79 ng/dL (ref 0.60–1.60)

## 2022-10-25 LAB — T3, FREE: T3, Free: 3.2 pg/mL (ref 2.3–4.2)

## 2022-10-25 LAB — TSH: TSH: 2 u[IU]/mL (ref 0.35–5.50)

## 2022-10-29 ENCOUNTER — Telehealth: Payer: Self-pay | Admitting: Family Medicine

## 2022-10-29 NOTE — Telephone Encounter (Signed)
Tried calling patient to  schedule Medicare Annual Wellness Visit (AWV) either virtually or in office.   No answer    Last AWV 09/06/21 please schedule with Nurse Health Adviser   45 min for awv-i  in office appointments 30 min for awv-s & awv-i phone/virtual appointments

## 2023-01-01 ENCOUNTER — Telehealth: Payer: Self-pay | Admitting: Family Medicine

## 2023-01-01 NOTE — Telephone Encounter (Signed)
Contacted [name] to schedule their annual wellness visit. Call back at later date: wcb patient will call back to schedule   Barkley Boards AWV direct phone # [phone]*

## 2023-02-11 ENCOUNTER — Other Ambulatory Visit: Payer: Self-pay | Admitting: Obstetrics and Gynecology

## 2023-02-11 ENCOUNTER — Telehealth: Payer: Self-pay | Admitting: Family Medicine

## 2023-02-11 DIAGNOSIS — Z803 Family history of malignant neoplasm of breast: Secondary | ICD-10-CM

## 2023-02-11 NOTE — Telephone Encounter (Signed)
Contacted Dan Maker to schedule their annual wellness visit. Appointment made for 02/27/23.  Rudell Cobb AWV direct phone # [phone]

## 2023-02-27 ENCOUNTER — Ambulatory Visit (INDEPENDENT_AMBULATORY_CARE_PROVIDER_SITE_OTHER): Payer: Medicare PPO

## 2023-02-27 VITALS — Ht 63.0 in | Wt 138.0 lb

## 2023-02-27 DIAGNOSIS — Z0000 Encounter for general adult medical examination without abnormal findings: Secondary | ICD-10-CM

## 2023-02-27 NOTE — Progress Notes (Signed)
Subjective:   Latasha Marshall is a 67 y.o. female who presents for Medicare Annual (Subsequent) preventive examination.  Review of Systems    Virtual Visit via Telephone Note  I connected with  Latasha Marshall on 02/27/23 at  8:45 AM EDT by telephone and verified that I am speaking with the correct person using two identifiers.  Location: Patient: Home Provider: Office Persons participating in the virtual visit: patient/Nurse Health Advisor   I discussed the limitations, risks, security and privacy concerns of performing an evaluation and management service by telephone and the availability of in person appointments. The patient expressed understanding and agreed to proceed.  Interactive audio and video telecommunications were attempted between this nurse and patient, however failed, due to patient having technical difficulties OR patient did not have access to video capability.  We continued and completed visit with audio only.  Some vital signs may be absent or patient reported.   Tillie Rung, LPN  Cardiac Risk Factors include: advanced age (>102men, >54 women)     Objective:    Today's Vitals   02/27/23 0854  Weight: 138 lb (62.6 kg)  Height: 5\' 3"  (1.6 m)   Body mass index is 24.45 kg/m.     02/27/2023    9:06 AM 09/06/2021    4:09 PM 05/30/2015    8:54 AM  Advanced Directives  Does Patient Have a Medical Advance Directive? Yes Yes Yes  Type of Estate agent of [place];Living will Healthcare Power of [place];Living will Healthcare Power of Attorney  Does patient want to make changes to medical advance directive?  No - Patient declined   Copy of Healthcare Power of Attorney in Chart? No - copy requested No - copy requested     Current Medications (verified) Outpatient Encounter Medications as of 02/27/2023  Medication Sig   b complex vitamins tablet Take 1 tablet by mouth daily.   fish oil-omega-3 fatty acids 1000 MG capsule Take 2 g  by mouth daily.     ibandronate (BONIVA) 150 MG tablet Take by mouth.   Multiple Vitamin (MULTIVITAMIN) tablet Take 1 tablet by mouth daily.     temazepam (RESTORIL) 15 MG capsule TAKE 1 CAPSULE (15 MG TOTAL) BY MOUTH AT BEDTIME AS NEEDED.   No facility-administered encounter medications on file as of 02/27/2023.    Allergies (verified) Patient has no known allergies.   History: Past Medical History:  Diagnosis Date   Heart murmur    as a child, resolved   Insomnia    Osteoporosis    dexa on 2010   Past Surgical History:  Procedure Laterality Date   COLONOSCOPY  06/20/2015   per Dr. Marina Goodell, hyperplastic polyps, repeat in 10 yrs    OVARIAN CYST REMOVAL  1981   POLYPECTOMY     Family History  Problem Relation Age of Onset   Stroke Other    Hypertension Other    Colon cancer Neg Hx    Social History   Socioeconomic History   Marital status: Divorced    Spouse name: Not on file   Number of children: Not on file   Years of education: Not on file   Highest education level: Not on file  Occupational History   Not on file  Tobacco Use   Smoking status: Never   Smokeless tobacco: Never  Vaping Use   Vaping Use: Never used  Substance and Sexual Activity   Alcohol use: Yes    Alcohol/week: 21.0 standard drinks  of alcohol    Types: 7 Standard drinks or equivalent, 14 Glasses of wine per week    Comment: glass of wine daily   Drug use: No   Sexual activity: Not on file  Other Topics Concern   Not on file  Social History Narrative   Married   Regular exercise- yes   Social Determinants of Health   Financial Resource Strain: Low Risk  (02/27/2023)   Overall Financial Resource Strain (CARDIA)    Difficulty of Paying Living Expenses: Not hard at all  Food Insecurity: No Food Insecurity (02/27/2023)   Hunger Vital Sign    Worried About Running Out of Food in the Last Year: Never true    Ran Out of Food in the Last Year: Never true  Transportation Needs: No  Transportation Needs (02/27/2023)   PRAPARE - Administrator, Civil Service (Medical): No    Lack of Transportation (Non-Medical): No  Physical Activity: Insufficiently Active (02/27/2023)   Exercise Vital Sign    Days of Exercise per Week: 2 days    Minutes of Exercise per Session: 60 min  Stress: No Stress Concern Present (02/27/2023)   [employer] of Occupational Health - Occupational Stress Questionnaire    Feeling of Stress : Not at all  Social Connections: Moderately Integrated (02/27/2023)   Social Connection and Isolation Panel [NHANES]    Frequency of Communication with Friends and Family: More than three times a week    Frequency of Social Gatherings with Friends and Family: More than three times a week    Attends Religious Services: More than 4 times per year    Active Member of [employer] or Organizations: Yes    Attends Engineer, structural: More than 4 times per year    Marital Status: Divorced    Tobacco Counseling Counseling given: Not Answered   Clinical Intake:  Pre-visit preparation completed: No  Pain : No/denies pain     BMI - recorded: 24.45 Nutritional Status: BMI of 19-24  Normal Nutritional Risks: None Diabetes: No  How often do you need to have someone help you when you read instructions, pamphlets, or other written materials from your doctor or pharmacy?: 1 - Never  Diabetic?  No  Interpreter Needed?: No  Information entered by :: Theresa Mulligan LPN   Activities of Daily Living    02/27/2023    9:03 AM 09/17/2022    2:28 PM  In your present state of health, do you have any difficulty performing the following activities:  Hearing? 0 0  Vision? 0 0  Difficulty concentrating or making decisions? 0 0  Walking or climbing stairs? 0 0  Dressing or bathing? 0 0  Doing errands, shopping? 0 0  Preparing Food and eating ? N N  Using the Toilet? N N  In the past six months, have you accidently leaked urine? N N  Do you have  problems with loss of bowel control? N N  Managing your Medications? N N  Managing your Finances? N N  Housekeeping or managing your Housekeeping? N N    Patient Care Team: Nelwyn Salisbury, MD as PCP - [facility] [place], Judye Bos, PA-C as Physician Assistant (Dermatology)  Indicate any recent Medical Services you may have received from other than [facility] providers in the past year (date may be approximate).     Assessment:   This is a routine wellness examination for Latasha Marshall.  Hearing/Vision screen Hearing Screening - Comments:: Denies hearing difficulties  Vision Screening - Comments:: Wears rx glasses - up to date with routine eye exams with  Dr Santiago Bumpers  Dietary issues and exercise activities discussed: Exercise limited by: None identified   Goals Addressed               This Visit's Progress     Maintain lifestyle (pt-stated)        Maintain current lifestyle.       Depression Screen    02/27/2023    9:02 AM 09/06/2021    3:38 PM  PHQ 2/9 Scores  PHQ - 2 Score 0 0    Fall Risk    02/27/2023    9:05 AM 09/17/2022    2:28 PM 09/06/2021    3:41 PM 09/06/2021    8:30 AM 09/02/2021    8:45 AM  Fall Risk   Falls in the past year? 0 0 0 0 0  Number falls in past yr: 0 0 0 0 0  Injury with Fall? 0 0 0 0 0  Risk for fall due to : No Fall Risks      Follow up Falls prevention discussed        FALL RISK PREVENTION PERTAINING TO THE HOME:  Any stairs in or around the home? No If so, are there any without handrails? No  Home free of loose throw rugs in walkways, pet beds, electrical cords, etc? Yes  Adequate lighting in your home to reduce risk of falls? Yes   ASSISTIVE DEVICES UTILIZED TO PREVENT FALLS:  Life alert? No  Use of a cane, walker or w/c? No  Grab bars in the bathroom? No  Shower chair or bench in shower? No  Elevated toilet seat or a handicapped toilet? No   TIMED UP AND GO:  Was the test performed? No . Audio Visit   Cognitive Function:         02/27/2023    9:06 AM 09/06/2021    3:44 PM  6CIT Screen  What Year? 0 points 0 points  What month? 0 points 0 points  What time? 0 points 0 points  Count back from 20 0 points 0 points  Months in reverse 0 points 0 points  Repeat phrase 0 points 0 points  Total Score 0 points 0 points    Immunizations Immunization History  Administered Date(s) Administered   Fluad Quad(high Dose 65+) 06/27/2021, 07/15/2022   Hep A / Hep B 02/18/2014, 08/10/2014   Hepatitis B, ADULT 05/03/2014   Influenza Split 07/22/2012   PFIZER Comirnaty(Gray Top)Covid-19 Tri-Sucrose Vaccine 11/12/2019, 12/03/2019, 07/04/2020, 06/22/2021   PFIZER(Purple Top)SARS-COV-2 Vaccination 06/22/2021   PPD Test 06/08/2020   Pfizer Covid-19 Vaccine Bivalent Booster 87yrs & up 07/21/2022   Pneumococcal Conjugate-13 05/15/2018   Pneumococcal Polysaccharide-23 05/26/2019   Respiratory Syncytial Virus Vaccine,Recomb Aduvanted(Arexvy) 08/01/2022   Tdap 02/18/2014   Zoster Recombinat (Shingrix) 09/03/2018, 12/23/2018   Zoster, Live 05/31/2011    TDAP status: Up to date  Flu Vaccine status: Up to date  Pneumococcal vaccine status: Up to date  Covid-19 vaccine status: Completed vaccines  Qualifies for Shingles Vaccine? Yes   Zostavax completed Yes   Shingrix Completed?: Yes  Screening Tests Health Maintenance  Topic Date Due   COVID-19 Vaccine (8 - 2023-24 season) 03/15/2023 (Originally 09/15/2022)   DEXA SCAN  02/27/2024 (Originally 05/03/2019)   INFLUENZA VACCINE  05/09/2023   DTaP/Tdap/Td (2 - Td or Tdap) 02/19/2024   Medicare Annual Wellness (AWV)  02/27/2024   MAMMOGRAM  03/14/2024  COLONOSCOPY (Pts 45-20yrs Insurance coverage will need to be confirmed)  06/19/2025   Pneumonia Vaccine 61+ Years old  Completed   Hepatitis C Screening  Completed   Zoster Vaccines- Shingrix  Completed   HPV VACCINES  Aged Out    Health Maintenance  There are no preventive care reminders to display for this  patient.   Colorectal cancer screening: Type of screening: Colonoscopy. Completed 06/20/15. Repeat every 10 years  Mammogram status: Ordered scheduled for 04/02/23. Pt provided with contact info and advised to call to schedule appt.   Bone Density status: Ordered Deferred. Pt provided with contact info and advised to call to schedule appt.  Lung Cancer Screening: (Low Dose CT Chest recommended if Age 64-80 years, 30 pack-year currently smoking OR have quit w/in 15years.)  qualify.     Additional Screening:  Hepatitis C Screening: does qualify; Completed 05/03/16  Vision Screening: Recommended annual ophthalmology exams for early detection of glaucoma and other disorders of the eye. Is the patient up to date with their annual eye exam?  Yes  Who is the provider or what is the name of the office in which the patient attends annual eye exams? Dr Santiago Bumpers If pt is not established with a provider, would they like to be referred to a provider to establish care? No .   Dental Screening: Recommended annual dental exams for proper oral hygiene  Community Resource Referral / Chronic Care Management:  CRR required this visit?  No   CCM required this visit?  No      Plan:     I have personally reviewed and noted the following in the patient's chart:   Medical and social history Use of alcohol, tobacco or illicit drugs  Current medications and supplements including opioid prescriptions. Patient is not currently taking opioid prescriptions. Functional ability and status Nutritional status Physical activity Advanced directives List of other physicians Hospitalizations, surgeries, and ER visits in previous 12 months Vitals Screenings to include cognitive, depression, and falls Referrals and appointments  In addition, I have reviewed and discussed with patient certain preventive protocols, quality metrics, and best practice recommendations. A written personalized care plan for  preventive services as well as general preventive health recommendations were provided to patient.     Tillie Rung, LPN   1/61/0960   Nurse Notes: None

## 2023-02-27 NOTE — Patient Instructions (Addendum)
Latasha Marshall , Thank you for taking time to come for your Medicare Wellness Visit. I appreciate your ongoing commitment to your health goals. Please review the following plan we discussed and let me know if I can assist you in the future.   These are the goals we discussed:  Goals       Maintain lifestyle (pt-stated)      Maintain current lifestyle.        This is a list of the screening recommended for you and due dates:  Health Maintenance  Topic Date Due   COVID-19 Vaccine (8 - 2023-24 season) 03/15/2023*   DEXA scan (bone density measurement)  02/27/2024*   Flu Shot  05/09/2023   DTaP/Tdap/Td vaccine (2 - Td or Tdap) 02/19/2024   Medicare Annual Wellness Visit  02/27/2024   Mammogram  03/14/2024   Colon Cancer Screening  06/19/2025   Pneumonia Vaccine  Completed   Hepatitis C Screening: USPSTF Recommendation to screen - Ages 18-79 yo.  Completed   Zoster (Shingles) Vaccine  Completed   HPV Vaccine  Aged Out  *Topic was postponed. The date shown is not the original due date.    Advanced directives: Please bring a copy of your health care power of attorney and living will to the office to be added to your chart at your convenience.   Conditions/risks identified: None  Next appointment: Follow up in one year for your annual wellness visit    Preventive Care 65 Years and Older, Female Preventive care refers to lifestyle choices and visits with your health care provider that can promote health and wellness. What does preventive care include? A yearly physical exam. This is also called an annual well check. Dental exams once or twice a year. Routine eye exams. Ask your health care provider how often you should have your eyes checked. Personal lifestyle choices, including: Daily care of your teeth and gums. Regular physical activity. Eating a healthy diet. Avoiding tobacco and drug use. Limiting alcohol use. Practicing safe sex. Taking low-dose aspirin every day. Taking  vitamin and mineral supplements as recommended by your health care provider. What happens during an annual well check? The services and screenings done by your health care provider during your annual well check will depend on your age, overall health, lifestyle risk factors, and family history of disease. Counseling  Your health care provider may ask you questions about your: Alcohol use. Tobacco use. Drug use. Emotional well-being. Home and relationship well-being. Sexual activity. Eating habits. History of falls. Memory and ability to understand (cognition). Work and work Astronomer. Reproductive health. Screening  You may have the following tests or measurements: Height, weight, and BMI. Blood pressure. Lipid and cholesterol levels. These may be checked every 5 years, or more frequently if you are over 51 years old. Skin check. Lung cancer screening. You may have this screening every year starting at age 26 if you have a 30-pack-year history of smoking and currently smoke or have quit within the past 15 years. Fecal occult blood test (FOBT) of the stool. You may have this test every year starting at age 52. Flexible sigmoidoscopy or colonoscopy. You may have a sigmoidoscopy every 5 years or a colonoscopy every 10 years starting at age 85. Hepatitis C blood test. Hepatitis B blood test. Sexually transmitted disease (STD) testing. Diabetes screening. This is done by checking your blood sugar (glucose) after you have not eaten for a while (fasting). You may have this done every 1-3 years. Bone density  scan. This is done to screen for osteoporosis. You may have this done starting at age 75. Mammogram. This may be done every 1-2 years. Talk to your health care provider about how often you should have regular mammograms. Talk with your health care provider about your test results, treatment options, and if necessary, the need for more tests. Vaccines  Your health care provider may  recommend certain vaccines, such as: Influenza vaccine. This is recommended every year. Tetanus, diphtheria, and acellular pertussis (Tdap, Td) vaccine. You may need a Td booster every 10 years. Zoster vaccine. You may need this after age 31. Pneumococcal 13-valent conjugate (PCV13) vaccine. One dose is recommended after age 21. Pneumococcal polysaccharide (PPSV23) vaccine. One dose is recommended after age 57. Talk to your health care provider about which screenings and vaccines you need and how often you need them. This information is not intended to replace advice given to you by your health care provider. Make sure you discuss any questions you have with your health care provider. Document Released: 10/21/2015 Document Revised: 06/13/2016 Document Reviewed: 07/26/2015 Elsevier Interactive Patient Education  2017 [employer].  Fall Prevention in the Home Falls can cause injuries. They can happen to people of all ages. There are many things you can do to make your home safe and to help prevent falls. What can I do on the outside of my home? Regularly fix the edges of walkways and driveways and fix any cracks. Remove anything that might make you trip as you walk through a door, such as a raised step or threshold. Trim any bushes or trees on the path to your home. Use bright outdoor lighting. Clear any walking paths of anything that might make someone trip, such as rocks or tools. Regularly check to see if handrails are loose or broken. Make sure that both sides of any steps have handrails. Any raised decks and porches should have guardrails on the edges. Have any leaves, snow, or ice cleared regularly. Use sand or salt on walking paths during winter. Clean up any spills in your garage right away. This includes oil or grease spills. What can I do in the bathroom? Use night lights. Install grab bars by the toilet and in the tub and shower. Do not use towel bars as grab bars. Use non-skid  mats or decals in the tub or shower. If you need to sit down in the shower, use a plastic, non-slip stool. Keep the floor dry. Clean up any water that spills on the floor as soon as it happens. Remove soap buildup in the tub or shower regularly. Attach bath mats securely with double-sided non-slip rug tape. Do not have throw rugs and other things on the floor that can make you trip. What can I do in the bedroom? Use night lights. Make sure that you have a light by your bed that is easy to reach. Do not use any sheets or blankets that are too big for your bed. They should not hang down onto the floor. Have a firm chair that has side arms. You can use this for support while you get dressed. Do not have throw rugs and other things on the floor that can make you trip. What can I do in the kitchen? Clean up any spills right away. Avoid walking on wet floors. Keep items that you use a lot in easy-to-reach places. If you need to reach something above you, use a strong step stool that has a grab bar. Keep electrical  cords out of the way. Do not use floor polish or wax that makes floors slippery. If you must use wax, use non-skid floor wax. Do not have throw rugs and other things on the floor that can make you trip. What can I do with my stairs? Do not leave any items on the stairs. Make sure that there are handrails on both sides of the stairs and use them. Fix handrails that are broken or loose. Make sure that handrails are as long as the stairways. Check any carpeting to make sure that it is firmly attached to the stairs. Fix any carpet that is loose or worn. Avoid having throw rugs at the top or bottom of the stairs. If you do have throw rugs, attach them to the floor with carpet tape. Make sure that you have a light switch at the top of the stairs and the bottom of the stairs. If you do not have them, ask someone to add them for you. What else can I do to help prevent falls? Wear shoes  that: Do not have high heels. Have rubber bottoms. Are comfortable and fit you well. Are closed at the toe. Do not wear sandals. If you use a stepladder: Make sure that it is fully opened. Do not climb a closed stepladder. Make sure that both sides of the stepladder are locked into place. Ask someone to hold it for you, if possible. Clearly mark and make sure that you can see: Any grab bars or handrails. First and last steps. Where the edge of each step is. Use tools that help you move around (mobility aids) if they are needed. These include: Canes. Walkers. Scooters. Crutches. Turn on the lights when you go into a dark area. Replace any light bulbs as soon as they burn out. Set up your furniture so you have a clear path. Avoid moving your furniture around. If any of your floors are uneven, fix them. If there are any pets around you, be aware of where they are. Review your medicines with your doctor. Some medicines can make you feel dizzy. This can increase your chance of falling. Ask your doctor what other things that you can do to help prevent falls. This information is not intended to replace advice given to you by your health care provider. Make sure you discuss any questions you have with your health care provider. Document Released: 07/21/2009 Document Revised: 03/01/2016 Document Reviewed: 10/29/2014 Elsevier Interactive Patient Education  2017 [employer].

## 2023-03-01 ENCOUNTER — Other Ambulatory Visit: Payer: Self-pay | Admitting: Family Medicine

## 2023-03-05 NOTE — Telephone Encounter (Signed)
Spoke with pt states that she has enough to get her through until PCP returns to the office. Pt LOV was 09/19/22 Last refill was done on 08/13/22 Please advise

## 2023-03-21 ENCOUNTER — Encounter: Payer: Self-pay | Admitting: Obstetrics and Gynecology

## 2023-04-02 ENCOUNTER — Ambulatory Visit
Admission: RE | Admit: 2023-04-02 | Discharge: 2023-04-02 | Disposition: A | Discharge status: Home / Self Care | Payer: Medicare PPO | Source: Ambulatory Visit | Attending: Obstetrics and Gynecology | Admitting: Obstetrics and Gynecology

## 2023-04-02 DIAGNOSIS — Z803 Family history of malignant neoplasm of breast: Secondary | ICD-10-CM

## 2023-04-02 DIAGNOSIS — Z1239 Encounter for other screening for malignant neoplasm of breast: Secondary | ICD-10-CM | POA: Diagnosis not present

## 2023-04-02 MED ORDER — GADOPICLENOL 0.5 MMOL/ML IV SOLN
6.0000 mL | Freq: Once | INTRAVENOUS | Status: AC | PRN
  Administered 2023-04-02: 6 mL via INTRAVENOUS

## 2023-07-08 ENCOUNTER — Ambulatory Visit: Payer: Medicare PPO

## 2023-07-08 ENCOUNTER — Encounter: Payer: Self-pay | Admitting: Family Medicine

## 2023-07-08 ENCOUNTER — Ambulatory Visit: Payer: Medicare PPO | Admitting: Family Medicine

## 2023-07-08 VITALS — BP 110/76 | HR 69 | Temp 98.4°F | Wt 144.8 lb

## 2023-07-08 DIAGNOSIS — G8929 Other chronic pain: Secondary | ICD-10-CM | POA: Diagnosis not present

## 2023-07-08 DIAGNOSIS — M4807 Spinal stenosis, lumbosacral region: Secondary | ICD-10-CM | POA: Diagnosis not present

## 2023-07-08 DIAGNOSIS — M545 Low back pain, unspecified: Secondary | ICD-10-CM | POA: Diagnosis not present

## 2023-07-08 DIAGNOSIS — M47816 Spondylosis without myelopathy or radiculopathy, lumbar region: Secondary | ICD-10-CM | POA: Diagnosis not present

## 2023-07-08 DIAGNOSIS — M47817 Spondylosis without myelopathy or radiculopathy, lumbosacral region: Secondary | ICD-10-CM | POA: Diagnosis not present

## 2023-07-08 MED ORDER — MELOXICAM 15 MG PO TABS: 15.0000 mg | ORAL_TABLET | Freq: Every day | ORAL | 2 refills | Status: DC

## 2023-07-08 NOTE — Progress Notes (Signed)
   Subjective:    Patient ID: Latasha Marshall, female    DOB: April 20, 1956, 67 y.o.   MRN: [id number]  HPI Here for right sided low back pain. This started about 5 years ago, and it has gotten worse over the past 6 months. No hx of trauma. No radiation of pain to the legs. She uses heat and takes takes Ibuprofen as needed. She does yoga and stretches frequently.    Review of Systems  Constitutional: Negative.   Respiratory: Negative.    Cardiovascular: Negative.   Musculoskeletal:  Positive for back pain.       Objective:   Physical Exam Constitutional:      Appearance: Normal appearance.  Cardiovascular:     Rate and Rhythm: Normal rate and regular rhythm.     Pulses: Normal pulses.     Heart sounds: Normal heart sounds.  Pulmonary:     Effort: Pulmonary effort is normal.     Breath sounds: Normal breath sounds.  Musculoskeletal:     Comments: She is mildly tender in the right lower back. No sciatic notch tenderness. The spine has full ROM.   Neurological:     Mental Status: She is alert.           Assessment & Plan:  Right low back pain. She can try Meloxicam 15 mg daily. Get Xrays of the lumbar spine today.  Gershon Crane, MD

## 2023-07-23 ENCOUNTER — Telehealth: Payer: Self-pay | Admitting: Family Medicine

## 2023-07-23 DIAGNOSIS — M810 Age-related osteoporosis without current pathological fracture: Secondary | ICD-10-CM | POA: Diagnosis not present

## 2023-07-23 DIAGNOSIS — Z6825 Body mass index (BMI) 25.0-25.9, adult: Secondary | ICD-10-CM | POA: Diagnosis not present

## 2023-07-23 DIAGNOSIS — Z01419 Encounter for gynecological examination (general) (routine) without abnormal findings: Secondary | ICD-10-CM | POA: Diagnosis not present

## 2023-07-23 NOTE — Telephone Encounter (Signed)
Pt is calling and would lumbar xray result

## 2023-07-24 NOTE — Telephone Encounter (Signed)
Spoke with pt advised that Dr Clent Ridges has not received x-ray results aware that the office will contact her with the results

## 2023-08-23 ENCOUNTER — Ambulatory Visit: Payer: Medicare PPO | Admitting: Family Medicine

## 2023-08-23 ENCOUNTER — Encounter: Payer: Self-pay | Admitting: Family Medicine

## 2023-08-23 VITALS — BP 118/70 | HR 63 | Temp 98.3°F | Wt 142.0 lb

## 2023-08-23 DIAGNOSIS — M545 Low back pain, unspecified: Secondary | ICD-10-CM

## 2023-08-23 DIAGNOSIS — G8929 Other chronic pain: Secondary | ICD-10-CM

## 2023-08-23 MED ORDER — MELOXICAM 15 MG PO TABS: 15.0000 mg | ORAL_TABLET | Freq: Every day | ORAL | 3 refills | Status: DC

## 2023-08-23 NOTE — Progress Notes (Signed)
   Subjective:    Patient ID: Latasha Marshall, female    DOB: 10-Oct-1955, 67 y.o.   MRN: [id number]  HPI Here for worsening right low back pain. This started about one year ago, and it has slowly progressed. Still no radiation to the legs. She had lumbar spine Xrays on 07-08-23 revealing only mild degenerative changes. She takes Meloxicam daily.    Review of Systems  Constitutional: Negative.   Respiratory: Negative.    Cardiovascular: Negative.   Musculoskeletal:  Positive for back pain.       Objective:   Physical Exam Constitutional:      Appearance: Normal appearance.  Cardiovascular:     Rate and Rhythm: Normal rate and regular rhythm.     Pulses: Normal pulses.     Heart sounds: Normal heart sounds.  Pulmonary:     Effort: Pulmonary effort is normal.     Breath sounds: Normal breath sounds.  Musculoskeletal:     Comments: Tender in the right lower back. Full ROM of the spine  Neurological:     Mental Status: She is alert.           Assessment & Plan:  Chronic right low back pain. We will refer her to PT.  Gershon Crane, MD

## 2023-08-28 DIAGNOSIS — L570 Actinic keratosis: Secondary | ICD-10-CM | POA: Diagnosis not present

## 2023-08-28 DIAGNOSIS — L578 Other skin changes due to chronic exposure to nonionizing radiation: Secondary | ICD-10-CM | POA: Diagnosis not present

## 2023-08-28 DIAGNOSIS — L814 Other melanin hyperpigmentation: Secondary | ICD-10-CM | POA: Diagnosis not present

## 2023-08-28 DIAGNOSIS — D229 Melanocytic nevi, unspecified: Secondary | ICD-10-CM | POA: Diagnosis not present

## 2023-08-28 DIAGNOSIS — L821 Other seborrheic keratosis: Secondary | ICD-10-CM | POA: Diagnosis not present

## 2023-08-30 ENCOUNTER — Encounter: Payer: Self-pay | Admitting: Rehabilitative and Restorative Service Providers"

## 2023-08-30 ENCOUNTER — Other Ambulatory Visit: Payer: Self-pay

## 2023-08-30 ENCOUNTER — Ambulatory Visit: Payer: Medicare PPO | Attending: Family Medicine | Admitting: Rehabilitative and Restorative Service Providers"

## 2023-08-30 DIAGNOSIS — M5459 Other low back pain: Secondary | ICD-10-CM | POA: Diagnosis not present

## 2023-08-30 DIAGNOSIS — M545 Low back pain, unspecified: Secondary | ICD-10-CM | POA: Diagnosis present

## 2023-08-30 DIAGNOSIS — G8929 Other chronic pain: Secondary | ICD-10-CM | POA: Diagnosis not present

## 2023-08-30 DIAGNOSIS — M6281 Muscle weakness (generalized): Secondary | ICD-10-CM | POA: Diagnosis not present

## 2023-08-30 DIAGNOSIS — R252 Cramp and spasm: Secondary | ICD-10-CM

## 2023-08-30 NOTE — Patient Instructions (Addendum)
DO's and DON'T's  Avoid and/or Minimize positions of forward bending ( flexion) Side bending and rotation of the trunk Especially when movements occur together   When your back aches:  Don't sit down  Lie down on your back with a small pillow under your head and one under your knees or as outlined by our therapist. Or, lie in the 90/90 position ( on the floor with your feet and legs on the sofa with knees and hips bent to 90 degrees)  Tying or putting on your shoes:  Don't bend over to tie your shoes or put on socks. Instead, bring one foot up, cross it over the opposite knee and bend forward (hinge) at the hips to so the task.  Keep your back straight.  If you cannot do this safely, then you need to use long handled assistive devices such as a shoehorn and sock puller.  Exercising: Don't engage in ballistic types of exercise routines such as high-impact aerobics or jumping rope Don't do exercises in the gym that bring you forward (abdominal crunches, sit-ups, touching your  toes, knee-to-chest, straight leg raising.) Follow a regular exercise program that includes a variety of different weight-bearing activities, such as low-impact aerobics, T' ai chi or walking as your physical therapist advises Do exercises that emphasize return to normal body alignment and strengthening of the muscles that keep your back straight, as outlined in this program or by your therapist  Household tasks: Don't reach unnecessarily or twist your trunk when mopping, sweeping, vacuuming, raking, making beds, weeding gardens, getting objects ou of cupboards, etc. Keep your broom, mop, vacuum, or rake close to you and mover your whole body as you move them. Walk over to the area on which you are working. Arrange kitchen, bathroom, and bedroom shelves so that frequently used items may be reached without excessive bending, twisting, and reaching.  Use a sturdy stool if  necessary. Don't bend from the waist to pick up something up  Off the floor, out of the trunk of your car, or to brush your teeth, wash your face, etc.  Bend at the knees, keeping back straight as possible. Use a reacher if necessary.   Prevention of fracture is the so-called "BOTTOm -Line" in the management of OSTEOPOROSIS. Do not take unnecessary chances in movement. Once a compression fracture occurs, the process is very difficult to control; one fracture is frequently followed by many more.        Osteoporosis   What is Osteoporosis?  A silent disease in which the skeleton is weakened by decreased bone density. Characterized by low bone mass, deterioration of bone, and increased risk of fracture postmenopausal (primary) or the result of an identifiable condition/event (secondary) Commonly found in the wrists, spine, and hips; these are high-risk stress areas and very susceptible to fractures.  The Facts: There are 1.5 million fractures/year 500,000 spine; 250,000 hip with over 60,000 nursing home admissions secondary to hip fracture; and 200,000 wrist After hip fracture, only 50% of people able to walk independently prior to the fracture return to independent ambulation. Bone mass: Peaks at age 51-30, and begins declining at age 79-50.   Osteoporosis is defined by the World Health Organization [facility]) as:  NOF/WHO Criteria for Interpreting Results of Bone Density Assessment  Results Diagnosis  Within 1 standard deviation (SD) of young adult mean Normal  Between 1 and -2.5 SD below mean, repeat in 2 years Low bone mass (osteopenia)  Greater than -2.5 SD below mean Osteoporosis  Greater than -2.5 SD below mean and one or more fragility fractures exist Severe Osteoporosis  *Results can be affected by positioning of the body in the DEXA scan, presence of current or old fractures, arthritis, extraneous calcifications.    Osteoporosis is not just a women's disease!  30-40% of women  will develop osteoporosis 5-15% of males will develop osteoporosis   What are the risk factors?  Female Thin, small frame Caucasian, Asian race Early menopause (<79 years old)/amenorrhea/delayed puberty Old age Family history (fractures, stooped posture)\ Low calcium diet Sedentary lifestyle Alcohol, Caffeine, Smoking Malnutrition, GI Disease Prolonged use of Glucocorticoids (Prednisone), Meds to treat asthma, arthritis, cancers, thyroid, and anti-seizure meds.  How do I know for sure?  Get a BONE DENSITY TEST!  This measures bone loss and it's painless, non-invasive, and only takes 5-10 minutes!  What can I do about it?  Decrease your risk factors (alcohol, caffeine, smoking) Helpful medications (see next page) Adequate Calcium and Vitamin D intake Get active! Proper posture - Sit and stand tall! No slouching or twisting Weight-Bearing Exercise - walking, stair climbing, elliptical; NO jogging or high-impact exercise. Resistive Exercise - Cybex weight equipment, Nautilus, dumbbells, therabands  **Be sure to maintain proper alignment when lifting any weight!!  **When using equipment, avoid abdominal exercises which involve "crunching" or curling or twisting the trunk, biceps machines, cross-country machines, moving handlebars, or ANY MACHINE WITH ROTATION OR FORWARD BENDING!!!           Approved Pharmacologic Management of Osteoporosis  Agent Approved for prevention Approved for treatment BMD increased spine/hip Fracture reduction  Estrogen/Hormone Therapy (Estrace, Estratab, Ogen, Premarin, Vivell, Prempro, Femhert, Orthoest) Yes Yes 3-6% 35% spine and hip  Bisphosphonates  (Fosamax, Actonel, Boniva) Yes Yes 3-8% 35-50% spine and non-spine  Calcitonin (Miacalcin, Calcimar, Fortical) No Yes 0-3% None stated  Raloxifene (Evista) Yes Yes 2-3% 30-55%  Parathyroid Hormone (Forteo) No Yes, only in those at high risk for fracture None stated 53-65%      Recommended Daily Calcium Intakes   Population Group NIH/NOF* (mg elemental calcium)  Children 1-10 years [phone]  Children 11-24 years 1200-1500  Men and Women 25-64 years At least 1200  Pregnant/Lactating At least 1200  Postmenopausal women with hormone replacement therapy At least 1200  Postmenopausal women without   hormone replacement therapy At least 1200  Men and women 65 + At least 1200  *In 1987, 1990, 1994, and 2000, the NIH held consensus conferences on osteoporosis and calcium.  This column shows the most recent recommendations regarding calcium intak for preventing and managing osteoporosis.          Calcium Content of Selected Foods  Dairy Foods Calcium Content (mg) Non-Dairy Foods Calcium Content (mg)  Buttermilk, 1 cup 300 Calcium-fortified juice, 1 cup 300  Milk, 1 cup 300 Salmon, canned with Bones, 2 oz 100  Lactaid milk, 1 cup 300-500 Oysters, raw 13-19 medium 226  Soy milk, 1 cup 200-300 Sardines, canned with bones, 3 oz 372  Yogurt (plain, lowfat) 1 cup 250-300 Shrimp, canned 3 oz 98  Frozen yogurt (fruit) 1 cup 200-600 Collard greens, cooked 1 cup 357  Cheddar, mozzarella, or [place] cheese, 1oz 205 Broccoli, cooked 1 cup 78  Cottage cheese (lowfat) 4 oz 200 Soybeans, cooked 1 cup 131  Part-skim ricotta cheese, 4oz 335 Tofu, 4oz* *  Vanilla ice cream, 1 cup 120-300    *Calcium content of tofu varies depending on processing method; check nutritional label on package for precise calcium content.  Suggested Guidelines for Calcium Supplement Use:  Calcium is absorbed most efficiently if taken in small amounts throughout the day.  Always divide the daily dose into smaller amounts if the total daily dose is 500mg  or more per day.  The body cannot use more than 500mg  Calcium at any one time. The use of manufactured supplements is encouraged.  Calcium as bone meal or dolomite may contain lead or other heavy metals as  contaminants. Calcium supplements should not be taken with high fiber meals or with bulk forming laxatives. If calcium carbonate is used as the supplement form, it should be taken with meals to assure that stomach acid production is present to facilitate optimal dissolution and absorption of calcium.  This is important if atrophic gastritis with hypo- or achlorhydria is present, which it is in 20-50% of older individuals. It is important to drink plenty of fluids while using the supplement to help reduce problems with side effects like constipation or bloating.  If these symptoms become a problem, switching to another form of supplement may be the answer. Another alternative is calcium-fortified foods, including fruit juices, cereals, and breads.  These foods are now marketed with added calcium and may be less likely to cause side effects. Those with personal or family histories of kidney stones should be monitored to assure that hypercalcuria does not occur. CALCIUM INTAKE QUIZ  Dairy products are the primary source of calcium for most people.  For a quick estimate of your daily calcium intake, complete the following steps:  Use the chart below to determine your daily intake of calcium from diary foods. Servings of dairy per day 1 2 3 4 5 6 7 8   Milligrams (mg) of calcium: 250 [phone] 1250 1500 1750 2000   2.  Enter your total daily calcium intake from dairy foods:     _____mg  3.  Add 350 mg, which is the average for all other dietary sources:                 +            350 mg  4.  The sum of your total daily calcium intake:               ______mg  5.  Enter the recommended calcium intake for your age from the chart below;         ______mg  6.  Enter your daily intake from step 4 above and subtract:                             -        _______mg  7.  The result is how much additional calcium you need:                                          ______mg      Recommended Daily  Calcium Intake  Population Calcium (mg)  Children 1-10 years [phone]  Children 11-24 years 1200-1500  Men and women 25-64 At least 1200  Pregnant/Lactating At least 1200  Postmenopausal women with hormone replacement therapy At least 1200  Postmenopausal women without hormone replacement therapy At least 1200  Men and women 65+ At least 1200       SAFETY TIPS FOR FALL PREVENTION   Remove throw rugs  and make certain carpet edges are securely fastened to the floor.  Reduce clutter, especially in traffic areas of the home.   Install/maintain sturdy handrails at stairs.  Increase wattage of lighting in hallways, bathrooms, kitchens, stairwells, and entrances to home.  Use night-lights near bed, in hallways, and in bathroom to improve night safety.  Install safety handrails in shower, tub, and around toilet.  Bathtubs and shower stalls should have non-skid surfaces.  When you must reach for something high, use a safety step stool, one with wide steps and a friction surface to stand on.  A type equipped with a high handrail is preferred.  If a cane or other walking aid has been recommended, use it to help increase your stability.  Wear supportive, cushioned, low-heeled shoes.  Avoid "scuffs" (backless bedroom slippers) and high heels.  Avoid rushing to answer a phone, doorbell, or anything else!  A portable phone that you can take from room to room with you is a good idea for security and safety.  Exercise regularly and stay active!!    Resources  National Osteoporosis [email or web address]   Exercise for Osteoporosis; A Safe and Effective Way to Build Bone Density and Muscle Strength By: Myra Gianotti, M.A.    RE-ALIGNMENT ROUTINE EXERCISES-OSTEOPROROSIS BASIC FOR POSTURAL CORRECTION   RE-ALIGNMENT Tips BENEFITS: 1.It helps to re-align the curves of the back and improve standing posture. 2.It allows the back muscles to rest and strengthen in preparation for more  activity. FREQUENCY: Daily, even after weeks, months and years of more advanced exercises. START: 1.All exercises start in the same position: lying on the back, arms resting on the supporting surface, palms up and slightly away from the body, backs of hands down, knees bent, feet flat. 2.The head, neck, arms, and legs are supported according to specific instructions of your therapist. Copyright  VHI. All rights reserved.    1. Decompression Exercise: Basic.   Takes compression off the vertebral bodies; increases tolerance for lying on the back; helps relieve back pain   Lie on back on firm surface, knees bent, feet flat, arms turned up, out to sides (~35 degrees). Head neck and arms supported as necessary. Time _5-15__ minutes. Surface: floor     2. Shoulder Press  Strengthens upper back extensors and scapular retractors.   Press both shoulders down. Hold _2-3__ seconds. Repeat _3-5__ times. Surface: floor        3. Head Press With Chin Tuck  Strengthens neck extensors   Tuck chin SLIGHTLY toward chest, keep mouth closed. Feel weight on back of head. Increase weight by pressing head down. Hold _2-3__ seconds. Relax. Repeat 3-5___ times. Surface: floor   4. Leg Lengthener: stretches quadratus lumborum and hip flexors.  Strengthens quads and ankle dorsiflexors.

## 2023-08-30 NOTE — Therapy (Signed)
OUTPATIENT PHYSICAL THERAPY THORACOLUMBAR EVALUATION   Patient Name: Latasha Marshall MRN: [id number] DOB:[birth year], 67 y.o., female Today's Date: 08/30/2023  END OF SESSION:  PT End of Session - 08/30/23 0806     Visit Number 1    Date for PT Re-Evaluation 10/25/23    Authorization Type [employer]    PT Start Time 0800    PT Stop Time 0840    PT Time Calculation (min) 40 min    Activity Tolerance Patient tolerated treatment well    Behavior During Therapy [place] for tasks assessed/performed             Past Medical History:  Diagnosis Date   Heart murmur    as a child, resolved   Insomnia    Osteoporosis    dexa on 2010   Past Surgical History:  Procedure Laterality Date   COLONOSCOPY  06/20/2015   per Dr. Marina Goodell, hyperplastic polyps, repeat in 10 yrs    OVARIAN CYST REMOVAL  1981   POLYPECTOMY     Patient Active Problem List   Diagnosis Date Noted   Insomnia 09/19/2022   LIPOMA 07/26/2009   Chronic right-sided low back pain without sciatica 05/12/2007   Disorder of bone and cartilage 05/12/2007    PCP: Nelwyn Salisbury, MD  REFERRING PROVIDER: Nelwyn Salisbury, MD  REFERRING DIAG: M54.50,G89.29 (ICD-10-CM) - Chronic right-sided low back pain without sciatica  Rationale for Evaluation and Treatment: Rehabilitation  THERAPY DIAG:  Other low back pain - Plan: PT plan of care cert/re-cert  Cramp and spasm - Plan: PT plan of care cert/re-cert  Muscle weakness (generalized) - Plan: PT plan of care cert/re-cert  ONSET DATE: "for years" intermittently, but in the past 2 years has gotten worse  SUBJECTIVE:                                                                                                                                                                                           SUBJECTIVE STATEMENT: Pt states that she has had intermittent pain for years, but 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  She  reports that after 4 hours of work, she is having increasing pain up to a 7/10.  Patient states that she went to Dr Clent Ridges and had imaging and was referred to PT.  PERTINENT HISTORY:  Osteoporosis  PAIN:  Are you having pain? Yes: NPRS scale: 0-7/10 Pain location: right low back Pain description: aching Aggravating factors: standing for prolonged time, walking Relieving factors: heating pad and medication  PRECAUTIONS: None  RED FLAGS: None   WEIGHT BEARING RESTRICTIONS:  No  FALLS:  Has patient fallen in last 6 months? 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  NEXT MD VISIT: Dr Clent Ridges on 09/24/2023  OBJECTIVE:  Note: Objective measures were completed at Evaluation unless otherwise noted.  DIAGNOSTIC FINDINGS:  Lumbar Radiograph on 07/08/2023: FINDINGS: Lumbar alignment within normal limits. Vertebral body heights are maintained. Moderate disc space narrowing at L5-S1. Mild facet degenerative changes of the lower lumbar spine  PATIENT SURVEYS:  Eval:  FOTO 54 (projected 63 by visit 11)   COGNITION: Overall cognitive status: Within functional limits for tasks assessed     SENSATION: Patient denies  MUSCLE LENGTH: Hamstrings: tightness bilaterally (R>L)  POSTURE: rounded shoulders  PALPATION: Tenderness to palpation along right lumbar paraspinals with spasms noted  LUMBAR ROM:   Eval:  WFL with pain at end range  LOWER EXTREMITY ROM:     Eval: WFL  LOWER EXTREMITY MMT:    Eval:  Right hip strength of 4 to 4+/5, otherwise, WFL  LUMBAR SPECIAL TESTS:  Slump test: slight positive on right  side, negative on left side  FUNCTIONAL TESTS:   Eval: 5 times sit to stand: 7.93 sec Single leg stance:  right- 17.26 sec, left- >30 sec  GAIT: Distance walked: >200 ft Assistive device utilized: None Level of assistance: Complete Independence Comments: Pt reports that she walk for around 30 minutes before she starts getting pain  TODAY'S TREATMENT:                                                                                                                              DATE: 08/30/2023 Patient educated about the role of PT and provided with information about osteoporosis and decompression exercises Educated about dry needling and given a handout Issued and reviewed HEP    PATIENT EDUCATION:  Education details: Issued HEP and education on osteoporosis Person educated: Patient Education method: Chief Technology Officer Education comprehension: verbalized understanding  HOME EXERCISE PROGRAM: Access Code: [email or web address] URL: [email or web address] Date: 08/30/2023 Prepared by: Reather Laurence  Exercises - Seated Scapular Retraction  - 1 x daily - 7 x weekly - 2 sets - 10 reps - Seated Hamstring Stretch  - 1 x daily - 7 x weekly - 2 reps - 20 sec hold - Seated Piriformis Stretch with Trunk Bend  - 1 x daily - 7 x weekly - 2 reps - 20 sec hold - Seated Transversus Abdominis Bracing  - 1 x daily - 7 x weekly - 2 sets - 10 reps  ASSESSMENT:  CLINICAL IMPRESSION: Patient is a 67 y.o. female who was seen today for physical  therapy evaluation and treatment for chronic low back pain. Patient's prior level of independence is able to work, travel, and go out with her friends without increased pain.  Patient reports that over the past 2 years, her pain has gradually been getting worse and more frequent.  Patient presents with increased pain, muscle spasms, slight hamstring tightness, and pain with standing and prolonged walking.  Patient would benefit from skilled PT to address  her functional impairments to allow her to return to her active lifestyle without increased pain.  OBJECTIVE IMPAIRMENTS: decreased balance, difficulty walking, decreased strength, increased muscle spasms, impaired flexibility, postural dysfunction, and pain.   ACTIVITY LIMITATIONS: lifting, bending, standing, squatting, and stairs  PARTICIPATION LIMITATIONS: cleaning, community activity, and occupation  PERSONAL FACTORS: Time since onset of injury/illness/exacerbation and 1 comorbidity: osteoporosis  are also affecting patient's functional outcome.   REHAB POTENTIAL: Good  CLINICAL DECISION MAKING: Stable/uncomplicated  EVALUATION COMPLEXITY: Low   GOALS: Goals reviewed with patient? Yes  SHORT TERM GOALS: Target date: 09/13/2023  Pt will be independent with initial HEP. Baseline: Goal status: INITIAL  2.  Patient will report at least a 30% improvement in symptoms since initial evaluation. Baseline:  Goal status: INITIAL   LONG TERM GOALS: Target date: 10/25/2023  Patient will be independent with advanced HEP. Baseline:  Goal status: INITIAL  2.  Patient will improve right single leg stance to greater than 30 seconds demonstrate improved right hip stability. Baseline:  Goal status: INITIAL  3.  Patient will increase functional LE strength to [facility] to allow her to perform a forward T with good hip stability. Baseline:  Goal status: INITIAL  4.  Patient will increase FOTO to at least 63 to demonstrate improvements in functional mobility. Baseline: 54 Goal status: INITIAL  5.  Patient will report being able to work a shift of work with pain no greater than 3/10. Baseline:  Goal status: INITIAL   PLAN:  PT FREQUENCY: 2x/week  PT DURATION: 8 weeks  PLANNED INTERVENTIONS: 97164- PT Re-evaluation, 97110-Therapeutic exercises, 97530- Therapeutic activity, [id number]- Neuromuscular re-education, 97535- Self Care, 16109- Manual therapy, [id number]- Gait training, [phone]- Canalith  repositioning, [id number]- Aquatic Therapy, 97014- Electrical stimulation (unattended), [id number]- Electrical stimulation (manual), [id number]- Ultrasound, [id number]- Traction (mechanical), [id number]- Ionotophoresis 4mg /ml Dexamethasone, Patient/Family education, Balance training, Stair training, Taping, Dry Needling, Joint mobilization, Joint manipulation, Spinal manipulation, Spinal mobilization, Vestibular training, Cryotherapy, and Moist heat.  PLAN FOR NEXT SESSION: Assess and progress HEP as indicated, strengthening, flexibility, manual/dry needling as indicated    Reather Laurence, PT, DPT 08/30/23, 9:25 AM  [facility] [address] [place], [place] [address] Phone # [phone] Fax [phone]

## 2023-09-02 ENCOUNTER — Encounter: Payer: Self-pay | Admitting: Physical Therapy

## 2023-09-02 ENCOUNTER — Ambulatory Visit: Payer: Medicare PPO | Admitting: Physical Therapy

## 2023-09-02 DIAGNOSIS — M5459 Other low back pain: Secondary | ICD-10-CM | POA: Diagnosis not present

## 2023-09-02 DIAGNOSIS — M6281 Muscle weakness (generalized): Secondary | ICD-10-CM | POA: Diagnosis not present

## 2023-09-02 DIAGNOSIS — R252 Cramp and spasm: Secondary | ICD-10-CM

## 2023-09-02 DIAGNOSIS — G8929 Other chronic pain: Secondary | ICD-10-CM | POA: Diagnosis not present

## 2023-09-02 NOTE — Therapy (Signed)
OUTPATIENT PHYSICAL THERAPY THORACOLUMBAR TREATMENT   Patient Name: Latasha Marshall MRN: [id number] DOB:01/26/1956, 67 y.o., female Today's Date: 09/02/2023  END OF SESSION:  PT End of Session - 09/02/23 1651     Visit Number 2    Date for PT Re-Evaluation 10/25/23    Authorization Type [employer]    Authorization Time Period 08/30/2023-10/24/2022    Authorization - Visit Number 2    Authorization - Number of Visits 16    Progress Note Due on Visit 10    PT Start Time 0933    PT Stop Time 1022    PT Time Calculation (min) 49 min    Activity Tolerance Patient tolerated treatment well    Behavior During Therapy [facility] for tasks assessed/performed              Past Medical History:  Diagnosis Date   Heart murmur    as a child, resolved   Insomnia    Osteoporosis    dexa on 2010   Past Surgical History:  Procedure Laterality Date   COLONOSCOPY  06/20/2015   per Dr. Marina Marshall, hyperplastic polyps, repeat in 10 yrs    OVARIAN CYST REMOVAL  1981   POLYPECTOMY     Patient Active Problem List   Diagnosis Date Noted   Insomnia 09/19/2022   LIPOMA 07/26/2009   Chronic right-sided low back pain without sciatica 05/12/2007   Disorder of bone and cartilage 05/12/2007    PCP: Latasha Salisbury, MD  REFERRING PROVIDER: Nelwyn Salisbury, MD  REFERRING DIAG: M54.50,G89.29 (ICD-10-CM) - Chronic right-sided low back pain without sciatica  Rationale for Evaluation and Treatment: Rehabilitation  THERAPY DIAG:  Other low back pain  Cramp and spasm  Muscle weakness (generalized)  ONSET DATE: "for years" intermittently, but in the past 2 years has gotten worse  SUBJECTIVE:                                                                                                                                                                                           SUBJECTIVE STATEMENT: Patient reports she is doing good today. She is not currently having any pain. She has been  compliant with HEP.   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  She reports that after 4 hours of work, she is having increasing pain up to a 7/10.  Patient states that she went to Dr Latasha Marshall and had imaging and was referred to PT.  PERTINENT HISTORY:  Osteoporosis  PAIN:  Are you having pain?  Yes: NPRS scale: 0-7/10 Pain location: right low back Pain description: aching Aggravating factors: standing for prolonged time, walking Relieving factors: heating pad and medication  PRECAUTIONS: None  RED FLAGS: None   WEIGHT BEARING RESTRICTIONS: No  FALLS:  Has patient fallen in last 6 months? 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  NEXT MD VISIT: Dr Latasha Marshall on 09/24/2023  OBJECTIVE:  Note: Objective measures were completed at Evaluation unless otherwise noted.  DIAGNOSTIC FINDINGS:  Lumbar Radiograph on 07/08/2023: FINDINGS: Lumbar alignment within normal limits. Vertebral body heights are maintained. Moderate disc space narrowing at L5-S1. Mild facet degenerative changes of the lower lumbar spine  PATIENT SURVEYS:  Eval:  FOTO 54 (projected 63 by visit 11)   COGNITION: Overall cognitive status: Within functional limits for tasks assessed     SENSATION: Patient denies  MUSCLE LENGTH: Hamstrings: tightness bilaterally (R>L)  POSTURE: rounded shoulders  PALPATION: Tenderness to palpation along right lumbar paraspinals with spasms noted  LUMBAR ROM:   Eval:  WFL with pain at end  range  LOWER EXTREMITY ROM:     Eval: WFL  LOWER EXTREMITY MMT:    Eval:  Right hip strength of 4 to 4+/5, otherwise, WFL  LUMBAR SPECIAL TESTS:  Slump test: slight positive on right side, negative on left side  FUNCTIONAL TESTS:   Eval: 5 times sit to stand: 7.93 sec Single leg stance:  right- 17.26 sec, left- >30 sec  GAIT: Distance walked: >200 ft Assistive device utilized: None Level of assistance: Complete Independence Comments: Pt reports that she walk for around 30 minutes before she starts getting pain  TODAY'S TREATMENT:                                                                                                                              DATE:  09/02/2023 NuStep Level 5 5 mins- PT present to discuss status Reviewed HEP Supine TA activation x 10 Supine TA activation + hip flexion x 10 each Supine TA activation + bent knee fallout x 10 each Hooklying hip abduction with black loop x 20 Sit to Stands 5 # KB 2 x10 Standing Marching 5# unilateral KB hold 2 x 10 Seated hinging x 3 - increased pain stopped exercise Manual Addaday: Rt lumbar paraspinals for improved circulation. PT present to monitor status.   08/30/2023 Patient educated about the role of PT and provided with information about osteoporosis and decompression exercises Educated about dry needling and given a handout Issued and reviewed HEP    PATIENT EDUCATION:  Education details: Issued HEP and education on osteoporosis Person educated: Patient Education  method: Explanation and Handouts Education comprehension: verbalized understanding  HOME EXERCISE PROGRAM: Access Code: [email or web address] URL: [email or web address] Date: 08/30/2023 Prepared by: Latasha Marshall  Exercises - Seated Scapular Retraction  - 1 x daily - 7 x weekly - 2 sets - 10 reps - Seated Hamstring Stretch  - 1 x daily - 7 x weekly - 2 reps - 20 sec hold - Seated Piriformis Stretch with Trunk Bend  - 1 x daily - 7 x  weekly - 2 reps - 20 sec hold - Seated Transversus Abdominis Bracing  - 1 x daily - 7 x weekly - 2 sets - 10 reps  ASSESSMENT:  CLINICAL IMPRESSION: Latasha Marshall presented to therapy today feeling good and not having increased back pain. She has been compliant with HEP. Educated patient on spinal stenosis and what we will focus on with therapy to help manage her symptoms. Provided patient with stretching and standing positions she can do while at work. Seated hinging aggravated patient's pain, so discontinued exercises. Patient verbalized relief after manual therapy techniques. She is interested in dry needling. Patient will benefit from skilled PT to address the below impairments and improve overall function.   OBJECTIVE IMPAIRMENTS: decreased balance, difficulty walking, decreased strength, increased muscle spasms, impaired flexibility, postural dysfunction, and pain.   ACTIVITY LIMITATIONS: lifting, bending, standing, squatting, and stairs  PARTICIPATION LIMITATIONS: cleaning, community activity, and occupation  PERSONAL FACTORS: Time since onset of injury/illness/exacerbation and 1 comorbidity: osteoporosis  are also affecting patient's functional outcome.   REHAB POTENTIAL: Good  CLINICAL DECISION MAKING: Stable/uncomplicated  EVALUATION COMPLEXITY: Low   GOALS: Goals reviewed with patient? Yes  SHORT TERM GOALS: Target date: 09/13/2023  Pt will be independent with initial HEP. Baseline: Goal status: INITIAL  2.  Patient will report at least a 30% improvement in symptoms since initial evaluation. Baseline:  Goal status: INITIAL   LONG TERM GOALS: Target date: 10/25/2023  Patient will be independent with advanced HEP. Baseline:  Goal status: INITIAL  2.  Patient will improve right single leg stance to greater than 30 seconds demonstrate improved right hip stability. Baseline:  Goal status: INITIAL  3.  Patient will increase functional LE strength to [facility] to allow her to  perform a forward T with good hip stability. Baseline:  Goal status: INITIAL  4.  Patient will increase FOTO to at least 63 to demonstrate improvements in functional mobility. Baseline: 54 Goal status: INITIAL  5.  Patient will report being able to work a shift of work with pain no greater than 3/10. Baseline:  Goal status: INITIAL   PLAN:  PT FREQUENCY: 2x/week  PT DURATION: 8 weeks  PLANNED INTERVENTIONS: 97164- PT Re-evaluation, 97110-Therapeutic exercises, 97530- Therapeutic activity, [id number]- Neuromuscular re-education, 97535- Self Care, 01027- Manual therapy, [id number]- Gait training, [id number]- Canalith repositioning, [id number]- Aquatic Therapy, 97014- Electrical stimulation (unattended), [id number]- Electrical stimulation (manual), [id number]- Ultrasound, [id number]- Traction (mechanical), [id number]- Ionotophoresis 4mg /ml Dexamethasone, Patient/Family education, Balance training, Stair training, Taping, Dry Needling, Joint mobilization, Joint manipulation, Spinal manipulation, Spinal mobilization, Vestibular training, Cryotherapy, and Moist heat.  PLAN FOR NEXT SESSION: continue core strengthening; lumbar and hip mobility;  manual/dry needling as indicated     Claude Manges, PT 09/02/23 4:55 PM [facility] [address] [place], [place] [address] Phone # [phone] Fax [phone]

## 2023-09-03 DIAGNOSIS — N958 Other specified menopausal and perimenopausal disorders: Secondary | ICD-10-CM | POA: Diagnosis not present

## 2023-09-03 DIAGNOSIS — M816 Localized osteoporosis [Lequesne]: Secondary | ICD-10-CM | POA: Diagnosis not present

## 2023-09-10 ENCOUNTER — Other Ambulatory Visit: Payer: Self-pay | Admitting: Family Medicine

## 2023-09-11 ENCOUNTER — Ambulatory Visit: Payer: Medicare PPO | Attending: Family Medicine | Admitting: Physical Therapy

## 2023-09-11 ENCOUNTER — Encounter: Payer: Self-pay | Admitting: Physical Therapy

## 2023-09-11 DIAGNOSIS — M5459 Other low back pain: Secondary | ICD-10-CM | POA: Insufficient documentation

## 2023-09-11 DIAGNOSIS — R252 Cramp and spasm: Secondary | ICD-10-CM | POA: Insufficient documentation

## 2023-09-11 DIAGNOSIS — M6281 Muscle weakness (generalized): Secondary | ICD-10-CM | POA: Insufficient documentation

## 2023-09-11 NOTE — Telephone Encounter (Signed)
Pt LOV 08/23/23 Please advise

## 2023-09-11 NOTE — Therapy (Signed)
OUTPATIENT PHYSICAL THERAPY THORACOLUMBAR TREATMENT   Patient Name: Latasha Marshall MRN: [id number] DOB:[birth year], 67 y.o., female Today's Date: 09/11/2023  END OF SESSION:  PT End of Session - 09/11/23 0847     Visit Number 3    Date for PT Re-Evaluation 10/25/23    Authorization Type [employer]    Authorization Time Period 08/30/2023-10/24/2022    Authorization - Visit Number 3    Authorization - Number of Visits 16    Progress Note Due on Visit 10    PT Start Time 0800    PT Stop Time 0845    PT Time Calculation (min) 45 min    Activity Tolerance Patient tolerated treatment well    Behavior During Therapy [facility] for tasks assessed/performed               Past Medical History:  Diagnosis Date   Heart murmur    as a child, resolved   Insomnia    Osteoporosis    dexa on 2010   Past Surgical History:  Procedure Laterality Date   COLONOSCOPY  06/20/2015   per Dr. Marina Goodell, hyperplastic polyps, repeat in 10 yrs    OVARIAN CYST REMOVAL  1981   POLYPECTOMY     Patient Active Problem List   Diagnosis Date Noted   Insomnia 09/19/2022   LIPOMA 07/26/2009   Chronic right-sided low back pain without sciatica 05/12/2007   Disorder of bone and cartilage 05/12/2007    PCP: Nelwyn Salisbury, MD  REFERRING PROVIDER: Nelwyn Salisbury, MD  REFERRING DIAG: M54.50,G89.29 (ICD-10-CM) - Chronic right-sided low back pain without sciatica  Rationale for Evaluation and Treatment: Rehabilitation  THERAPY DIAG:  Other low back pain  Cramp and spasm  Muscle weakness (generalized)  ONSET DATE: "for years" intermittently, but in the past 2 years has gotten worse  SUBJECTIVE:                                                                                                                                                                                           SUBJECTIVE STATEMENT: Patient reports she is doing good today. Her back is feeling a lot better.   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  She reports that after 4 hours of work, she is having increasing pain up to a 7/10.  Patient states that she went to Dr Clent Ridges and had imaging and was referred to PT.  PERTINENT HISTORY:  Osteoporosis  PAIN: 09/11/2023 Are you having pain? Yes: NPRS scale: 0/10 Pain  location: right low back Pain description: aching Aggravating factors: standing for prolonged time, walking Relieving factors: heating pad and medication  PRECAUTIONS: None  RED FLAGS: None   WEIGHT BEARING RESTRICTIONS: No  FALLS:  Has patient fallen in last 6 months? 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  NEXT MD VISIT: Dr Clent Ridges on 09/24/2023  OBJECTIVE:  Note: Objective measures were completed at Evaluation unless otherwise noted.  DIAGNOSTIC FINDINGS:  Lumbar Radiograph on 07/08/2023: FINDINGS: Lumbar alignment within normal limits. Vertebral body heights are maintained. Moderate disc space narrowing at L5-S1. Mild facet degenerative changes of the lower lumbar spine  PATIENT SURVEYS:  Eval:  FOTO 54 (projected 63 by visit 11)   COGNITION: Overall cognitive status: Within functional limits for tasks assessed     SENSATION: Patient denies  MUSCLE LENGTH: Hamstrings: tightness bilaterally (R>L)  POSTURE: rounded shoulders  PALPATION: Tenderness to palpation along right lumbar paraspinals with spasms noted  LUMBAR ROM:   Eval:  WFL with pain at end range  LOWER EXTREMITY ROM:     Eval:  WFL  LOWER EXTREMITY MMT:    Eval:  Right hip strength of 4 to 4+/5, otherwise, WFL  LUMBAR SPECIAL TESTS:  Slump test: slight positive on right side, negative on left side  FUNCTIONAL TESTS:   Eval: 5 times sit to stand: 7.93 sec Single leg stance:  right- 17.26 sec, left- >30 sec  GAIT: Distance walked: >200 ft Assistive device utilized: None Level of assistance: Complete Independence Comments: Pt reports that she walk for around 30 minutes before she starts getting pain  TODAY'S TREATMENT:                                                                                                                              DATE:  09/11/2023 NuStep Level 5 6 mins- PT present to discuss status 3 way SB stretch x 8 each way Seated SB posterior tilt x 20  Supine TA activation + hip flexion x 10 each Supine TA activation + bent knee fallout x 10 each Hooklying hip abduction with black loop x 20 Sit to Stands 7 # KB 2 x10 Hip Matrix (abduction, extension ) 30# 2 x 10 bilateral  Pallof Press with red long loop x 20 each direction [employer] 8# Dbs x 3 laps around gym Manual Addaday: Rt lumbar paraspinals for improved circulation. PT present to monitor status.   09/02/2023 NuStep Level 5 5 mins- PT present to discuss status Reviewed HEP Supine TA activation x 10 Supine TA activation + hip flexion x 10 each Supine TA activation + bent  knee fallout x 10 each Hooklying hip abduction with black loop x 20 Sit to Stands 5 # KB 2 x10 Standing Marching 5# unilateral KB hold 2 x 10 Seated hinging x 3 - increased pain stopped exercise Manual Addaday: Rt lumbar paraspinals for improved circulation. PT present to monitor status.   08/30/2023 Patient educated about the role of PT and provided with information about osteoporosis and decompression exercises Educated about dry needling and given a handout Issued and reviewed HEP    PATIENT EDUCATION:  Education details: Issued HEP and  education on osteoporosis Person educated: Patient Education method: Chief Technology Officer Education comprehension: verbalized understanding  HOME EXERCISE PROGRAM: Access Code: [email or web address] URL: [email or web address] Date: 08/30/2023 Prepared by: Reather Laurence  Exercises - Seated Scapular Retraction  - 1 x daily - 7 x weekly - 2 sets - 10 reps - Seated Hamstring Stretch  - 1 x daily - 7 x weekly - 2 reps - 20 sec hold - Seated Piriformis Stretch with Trunk Bend  - 1 x daily - 7 x weekly - 2 reps - 20 sec hold - Seated Transversus Abdominis Bracing  - 1 x daily - 7 x weekly - 2 sets - 10 reps  ASSESSMENT:  CLINICAL IMPRESSION: [name] presents to skilled therapy today with decreased overall back pain. She has been compliant with her home exercises and she has been incorporating TA activation throughout her day. Today's treatment session focused on lumbar mobility and core strengthening. Patient tolerated treatment session well and did not verbalize any increased back pain. She required verbal and visual cues for correct exercise performance. Patient will benefit from skilled PT to address the below impairments and improve overall function.    OBJECTIVE IMPAIRMENTS: decreased balance, difficulty walking, decreased strength, increased muscle spasms, impaired flexibility, postural dysfunction, and pain.   ACTIVITY LIMITATIONS: lifting, bending, standing, squatting, and stairs  PARTICIPATION LIMITATIONS: cleaning, community activity, and occupation  PERSONAL FACTORS: Time since onset of injury/illness/exacerbation and 1 comorbidity: osteoporosis  are also affecting patient's functional outcome.   REHAB POTENTIAL: Good  CLINICAL DECISION MAKING: Stable/uncomplicated  EVALUATION COMPLEXITY: Low   GOALS: Goals reviewed with patient? Yes  SHORT TERM GOALS: Target date: 09/13/2023  Pt will be independent with initial HEP. Baseline: Goal status: INITIAL  2.  Patient  will report at least a 30% improvement in symptoms since initial evaluation. Baseline:  Goal status: INITIAL   LONG TERM GOALS: Target date: 10/25/2023  Patient will be independent with advanced HEP. Baseline:  Goal status: INITIAL  2.  Patient will improve right single leg stance to greater than 30 seconds demonstrate improved right hip stability. Baseline:  Goal status: INITIAL  3.  Patient will increase functional LE strength to [facility] to allow her to perform a forward T with good hip stability. Baseline:  Goal status: INITIAL  4.  Patient will increase FOTO to at least 63 to demonstrate improvements in functional mobility. Baseline: 54 Goal status: INITIAL  5.  Patient will report being able to work a shift of work with pain no greater than 3/10. Baseline:  Goal status: INITIAL   PLAN:  PT FREQUENCY: 2x/week  PT DURATION: 8 weeks  PLANNED INTERVENTIONS: 97164- PT Re-evaluation, 97110-Therapeutic exercises, 97530- Therapeutic activity, [id number]- Neuromuscular re-education, 97535- Self Care, 54098- Manual therapy, [id number]- Gait training, [phone]- Canalith repositioning, [id number]- Aquatic Therapy, 97014- Electrical stimulation (unattended), [id number]- Electrical stimulation (manual), [id number]- Ultrasound, [id number]- Traction (mechanical), [id number]- Ionotophoresis 4mg /ml Dexamethasone, Patient/Family education, Balance training, Stair training, Taping, Dry Needling, Joint  mobilization, Joint manipulation, Spinal manipulation, Spinal mobilization, Vestibular training, Cryotherapy, and Moist heat.  PLAN FOR NEXT SESSION: continue core strengthening (90/90 toe taps); lumbar and hip mobility;  manual/dry needling as indicated     Claude Manges, PT 09/11/23 8:53 AM [facility] [address], [address] [place], [place] [address] Phone # [phone] Fax [phone]

## 2023-09-17 ENCOUNTER — Ambulatory Visit: Payer: Medicare PPO | Admitting: Physical Therapy

## 2023-09-17 ENCOUNTER — Telehealth: Payer: Self-pay | Admitting: Physical Therapy

## 2023-09-17 NOTE — Telephone Encounter (Signed)
Called Latasha Marshall about her missed appointment today at 8:45am. Patient verbalized getting appointment times mixed up and thought her appointment was at 9:30am. She requested to cancel her next appointment on Thursday 2/12 and 8:45am. She confirmed her next appointment 12/18 at 8:45am.   Claude Manges, PT 09/17/23 9:11 AM

## 2023-09-19 ENCOUNTER — Encounter: Payer: Medicare PPO | Admitting: Rehabilitative and Restorative Service Providers"

## 2023-09-24 ENCOUNTER — Encounter: Payer: Medicare PPO | Admitting: Family Medicine

## 2023-09-25 ENCOUNTER — Encounter: Payer: Medicare PPO | Admitting: Physical Therapy

## 2023-09-27 ENCOUNTER — Ambulatory Visit: Payer: Medicare PPO | Admitting: Rehabilitative and Restorative Service Providers"

## 2023-09-27 ENCOUNTER — Encounter: Payer: Self-pay | Admitting: Rehabilitative and Restorative Service Providers"

## 2023-09-27 DIAGNOSIS — M5459 Other low back pain: Secondary | ICD-10-CM | POA: Diagnosis not present

## 2023-09-27 DIAGNOSIS — M6281 Muscle weakness (generalized): Secondary | ICD-10-CM

## 2023-09-27 DIAGNOSIS — R252 Cramp and spasm: Secondary | ICD-10-CM | POA: Diagnosis not present

## 2023-09-27 NOTE — Patient Instructions (Signed)
 Trigger Point Dry Needling  What is Trigger Point Dry Needling (DN)? DN is a physical therapy technique used to treat muscle pain and dysfunction. Specifically, DN helps deactivate muscle trigger points (muscle knots).  A thin filiform needle is used to penetrate the skin and stimulate the underlying trigger point. The goal is for a local twitch response (LTR) to occur and for the trigger point to relax. No medication of any kind is injected during the procedure.   What Does Trigger Point Dry Needling Feel Like?  The procedure feels different for each individual patient. Some patients report that they do not actually feel the needle enter the skin and overall the process is not painful. Very mild bleeding may occur. However, many patients feel a deep cramping in the muscle in which the needle was inserted. This is the local twitch response.   How Will I feel after the treatment? Soreness is normal, and the onset of soreness may not occur for a few hours. Typically this soreness does not last longer than two days.  Bruising is uncommon, however; ice can be used to decrease any possible bruising.  In rare cases feeling tired or nauseous after the treatment is normal. In addition, your symptoms may get worse before they get better, this period will typically not last longer than 24 hours.   What Can I do After My Treatment? Increase your hydration by drinking more water for the next 24 hours.  You may place ice or heat on the areas treated that have become sore, however, do not use heat on inflamed or bruised areas. Heat often brings more relief post needling. You can continue your regular activities, but vigorous activity is not recommended initially after the treatment for 24 hours. DN is best combined with other physical therapy such as strengthening, stretching, and other therapies.   What are the complications? While your therapist has had extensive training in minimizing the risks of trigger  point dry needling, it is important to understand the risks of any procedure.  Risks include bleeding, pain, fatigue, hematoma, infection, vertigo, nausea or nerve involvement. Monitor for any changes to your skin or sensation. Contact your therapist or MD with concerns.  A rare but serious complication is a pneumothorax over or near your middle and upper chest and back If you have dry needling in this area, monitor for the following symptoms: Shortness of breath on exertion and/or Difficulty taking a deep breath and/or Chest Pain and/or A dry cough If any of the above symptoms develop, please go to the nearest emergency room or call 911. Tell them you had dry needling over your thorax and report any symptoms you are having. Please follow-up with your treating therapist after you complete the medical evaluation.   [facility]  [address] [place] [address].  [phone]

## 2023-09-27 NOTE — Therapy (Signed)
OUTPATIENT PHYSICAL THERAPY TREATMENT NOTE   Patient Name: Latasha Marshall MRN: [id number] DOB:[birth year], 67 y.o., female Today's Date: 09/27/2023  END OF SESSION:  PT End of Session - 09/27/23 0846     Visit Number 4    Date for PT Re-Evaluation 10/25/23    Authorization Type [employer]    Authorization Time Period 08/30/2023-10/24/2022    Authorization - Visit Number 4    Authorization - Number of Visits 16    Progress Note Due on Visit 10    PT Start Time 0845    PT Stop Time 0925    PT Time Calculation (min) 40 min    Activity Tolerance Patient tolerated treatment well    Behavior During Therapy [facility] for tasks assessed/performed               Past Medical History:  Diagnosis Date   Heart murmur    as a child, resolved   Insomnia    Osteoporosis    dexa on 2010   Past Surgical History:  Procedure Laterality Date   COLONOSCOPY  06/20/2015   per Dr. Marina Goodell, hyperplastic polyps, repeat in 10 yrs    OVARIAN CYST REMOVAL  1981   POLYPECTOMY     Patient Active Problem List   Diagnosis Date Noted   Insomnia 09/19/2022   LIPOMA 07/26/2009   Chronic right-sided low back pain without sciatica 05/12/2007   Disorder of bone and cartilage 05/12/2007    PCP: Nelwyn Salisbury, MD  REFERRING PROVIDER: Nelwyn Salisbury, MD  REFERRING DIAG: M54.50,G89.29 (ICD-10-CM) - Chronic right-sided low back pain without sciatica  Rationale for Evaluation and Treatment: Rehabilitation  THERAPY DIAG:  Other low back pain  Cramp and spasm  Muscle weakness (generalized)  ONSET DATE: "for years" intermittently, but in the past 2 years has gotten worse  SUBJECTIVE:                                                                                                                                                                                           SUBJECTIVE STATEMENT: Patient reports that overall, she is feeling at least 25% better since starting PT.  Patient states that  she is still having some tightness in her back that she feels that she cannot stretch out fully.  PERTINENT HISTORY:  Osteoporosis  PAIN: 09/11/2023 Are you having pain? Yes: NPRS scale: 2/10 Pain location: right low back Pain description: aching Aggravating factors: standing for prolonged time, walking Relieving factors: heating pad and medication  PRECAUTIONS: None  RED FLAGS: None   WEIGHT BEARING RESTRICTIONS: No  FALLS:  Has patient fallen in last  6 months? 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  NEXT MD VISIT: Dr Clent Ridges on 09/24/2023  OBJECTIVE:  Note: Objective measures were completed at Evaluation unless otherwise noted.  DIAGNOSTIC FINDINGS:  Lumbar Radiograph on 07/08/2023: FINDINGS: Lumbar alignment within normal limits. Vertebral body heights are maintained. Moderate disc space narrowing at L5-S1. Mild facet degenerative changes of the lower lumbar spine  PATIENT SURVEYS:  Eval:  FOTO 54 (projected 63 by visit 11)   COGNITION: Overall cognitive status: Within functional limits for tasks assessed     SENSATION: Patient denies  MUSCLE LENGTH: Hamstrings: tightness bilaterally (R>L)  POSTURE: rounded shoulders  PALPATION: Tenderness to palpation along right lumbar paraspinals with spasms noted  LUMBAR ROM:   Eval:  WFL with pain at end range  LOWER EXTREMITY ROM:     Eval: WFL  LOWER EXTREMITY MMT:    Eval:  Right hip strength of 4 to 4+/5, otherwise, WFL  LUMBAR SPECIAL TESTS:  Slump test: slight positive on right side, negative on left side  FUNCTIONAL TESTS:    Eval: 5 times sit to stand: 7.93 sec Single leg stance:  right- 17.26 sec, left- >30 sec  GAIT: Distance walked: >200 ft Assistive device utilized: None Level of assistance: Complete Independence Comments: Pt reports that she walk for around 30 minutes before she starts getting pain  TODAY'S TREATMENT:                                                                                                                              DATE:  09/27/2023 Nustep level 5 x6 min with PT present to discuss status Seated hamstring stretch 2x20 sec bilat Seated piriformis stretch 2x20 sec bilat Seated 3 way blue pball rollout 4x10 sec each Seated on blue pball:  4 way pelvic tilts x15, CW/CCW pelvic clocks x15 each way, marching x20, LAQ x20 Trigger Point Dry-Needling  Treatment instructions: Expect mild to moderate muscle soreness. S/S of pneumothorax if dry needled over a lung field, and to seek immediate medical attention should they occur. Patient verbalized understanding of these instructions and education. Patient Consent Given: Yes Education handout provided: Yes Muscles treated: bilateral lumbar multifidi Electrical stimulation performed: No Parameters: N/A Treatment response/outcome: Utilized skilled palpation to identify bony landmarks and trigger points.  Able to illicit twitch response and muscle elongation.  Soft tissue mobilization following to further promote tissue elongation.   Manual Therapy:  soft tissue mobilization and silicone cupping to bilateral lumbar paraspinal area to promote tissue elongation and decreased pain.     09/11/2023 NuStep Level 5 6 mins- PT present to discuss  status 3 way SB stretch x 8 each way Seated SB posterior tilt x 20  Supine TA activation + hip flexion x 10 each Supine TA activation + bent knee fallout x 10 each Hooklying hip abduction with black loop x 20 Sit to Stands 7 # KB 2 x10 Hip Matrix (abduction, extension ) 30# 2 x 10 bilateral   Pallof Press with red long loop x 20 each direction Farmer's Carries 8# Dbs x 3 laps around gym Manual Addaday: Rt lumbar paraspinals for improved circulation. PT present to monitor status.   09/02/2023 NuStep Level 5 5 mins- PT present to discuss status Reviewed HEP Supine TA activation x 10 Supine TA activation + hip flexion x 10 each Supine TA activation + bent knee fallout x 10 each Hooklying hip abduction with black loop x 20 Sit to Stands 5 # KB 2 x10 Standing Marching 5# unilateral KB hold 2 x 10 Seated hinging x 3 - increased pain stopped exercise Manual Addaday: Rt lumbar paraspinals for improved circulation. PT present to monitor status.     PATIENT EDUCATION:  Education details: Issued HEP and education on osteoporosis Person educated: Patient Education method: Chief Technology Officer Education comprehension: verbalized understanding  HOME EXERCISE PROGRAM: Access Code: [email or web address] URL: [email or web address] Date: 08/30/2023 Prepared by: Reather Laurence  Exercises - Seated Scapular Retraction  - 1 x daily - 7 x weekly - 2 sets - 10 reps - Seated Hamstring Stretch  - 1 x daily - 7 x weekly - 2 reps - 20 sec hold - Seated Piriformis Stretch with Trunk Bend  - 1 x daily - 7 x weekly - 2 reps - 20 sec hold - Seated Transversus Abdominis Bracing  - 1 x daily - 7 x weekly - 2 sets - 10 reps  ASSESSMENT:  CLINICAL IMPRESSION: Ms Castricone presents to skilled PT reporting some increased pain today with overall reports feelings of tightness in her low back muscles.  Patient educated on dry needling and provided with printed handout.  Patient verbalizes agreement to dry needling during session today.  Patient able to perform stretching with minimal cuing for improved form.  Additionally, performed pelvic tilts and rotations on blue theraband with patient reporting decreased pain following. Patient with good twitch response to bilateral lumbar multifidi dry needling.   With manual therapy following treatment, patient did report feeling looser and more mobile.  Patient able to demonstrate lumbar flexion and she reported decreased pain.  Patient continues to require skilled PT to progress with goal related activities.    OBJECTIVE IMPAIRMENTS: decreased balance, difficulty walking, decreased strength, increased muscle spasms, impaired flexibility, postural dysfunction, and pain.   ACTIVITY LIMITATIONS: lifting, bending, standing, squatting, and stairs  PARTICIPATION LIMITATIONS: cleaning, community activity, and occupation  PERSONAL FACTORS: Time since onset of injury/illness/exacerbation and 1 comorbidity: osteoporosis  are also affecting patient's functional outcome.   REHAB POTENTIAL: Good  CLINICAL DECISION MAKING: Stable/uncomplicated  EVALUATION COMPLEXITY: Low   GOALS: Goals reviewed with patient? Yes  SHORT TERM GOALS: Target date: 09/13/2023  Pt will be independent with initial HEP. Baseline: Goal status: Met  2.  Patient will report at least a 30% improvement in symptoms since initial evaluation. Baseline:  Goal status: Partially Met with 25% improvement noted on 09/27/23   LONG TERM GOALS: Target date: 10/25/2023  Patient will be independent with advanced HEP. Baseline:  Goal status: Ongoing  2.  Patient will improve right single leg stance to greater than 30 seconds demonstrate improved right hip  stability. Baseline:  Goal status: INITIAL  3.  Patient will increase functional LE strength to [facility] to allow her to perform a forward T with good hip stability. Baseline:  Goal status: INITIAL  4.  Patient will increase FOTO to at least 63 to demonstrate improvements in functional mobility. Baseline: 54 Goal status: INITIAL  5.  Patient will report being able to work a shift of work with pain no greater than 3/10. Baseline:  Goal status: INITIAL   PLAN:  PT FREQUENCY: 2x/week  PT DURATION: 8 weeks  PLANNED  INTERVENTIONS: 97164- PT Re-evaluation, 97110-Therapeutic exercises, 97530- Therapeutic activity, [id number]- Neuromuscular re-education, 97535- Self Care, 62130- Manual therapy, [id number]- Gait training, [phone]- Canalith repositioning, [id number]- Aquatic Therapy, 97014- Electrical stimulation (unattended), [id number]- Electrical stimulation (manual), [id number]- Ultrasound, [id number]- Traction (mechanical), [id number]- Ionotophoresis 4mg /ml Dexamethasone, Patient/Family education, Balance training, Stair training, Taping, Dry Needling, Joint mobilization, Joint manipulation, Spinal manipulation, Spinal mobilization, Vestibular training, Cryotherapy, and Moist heat.  PLAN FOR NEXT SESSION: Assess and progress HEP as indicated, strengthening, flexibility, manual/dry needling as indicated     Reather Laurence, PT, DPT 09/27/23, 10:15 AM  [facility] [address] [place], [place] [address] Phone # [phone] Fax [phone]

## 2023-09-30 DIAGNOSIS — Z1231 Encounter for screening mammogram for malignant neoplasm of breast: Secondary | ICD-10-CM | POA: Diagnosis not present

## 2023-10-03 ENCOUNTER — Ambulatory Visit: Payer: Medicare PPO | Admitting: Rehabilitative and Restorative Service Providers"

## 2023-10-03 ENCOUNTER — Encounter: Payer: Self-pay | Admitting: Rehabilitative and Restorative Service Providers"

## 2023-10-03 DIAGNOSIS — M5459 Other low back pain: Secondary | ICD-10-CM | POA: Diagnosis not present

## 2023-10-03 DIAGNOSIS — M6281 Muscle weakness (generalized): Secondary | ICD-10-CM | POA: Diagnosis not present

## 2023-10-03 DIAGNOSIS — R252 Cramp and spasm: Secondary | ICD-10-CM | POA: Diagnosis not present

## 2023-10-03 NOTE — Therapy (Addendum)
OUTPATIENT PHYSICAL THERAPY TREATMENT NOTE AND LATE ENTRY DISCHARGE SUMMARY   Patient Name: Latasha Marshall MRN: [id number] DOB:[birth year], 67 y.o., female Today's Date: 10/03/2023  END OF SESSION:  PT End of Session - 10/03/23 0855     Visit Number 5    Date for PT Re-Evaluation 10/25/23    Authorization Type [employer]    Authorization Time Period 08/30/2023-10/24/2022    Authorization - Visit Number 5    Authorization - Number of Visits 16    Progress Note Due on Visit 10    PT Start Time 0845    PT Stop Time 0928    PT Time Calculation (min) 43 min    Activity Tolerance Patient tolerated treatment well    Behavior During Therapy [facility] for tasks assessed/performed               Past Medical History:  Diagnosis Date   Heart murmur    as a child, resolved   Insomnia    Osteoporosis    dexa on 2010   Past Surgical History:  Procedure Laterality Date   COLONOSCOPY  06/20/2015   per Dr. Marina Goodell, hyperplastic polyps, repeat in 10 yrs    OVARIAN CYST REMOVAL  1981   POLYPECTOMY     Patient Active Problem List   Diagnosis Date Noted   Insomnia 09/19/2022   LIPOMA 07/26/2009   Chronic right-sided low back pain without sciatica 05/12/2007   Disorder of bone and cartilage 05/12/2007    PCP: Nelwyn Salisbury, MD  REFERRING PROVIDER: Nelwyn Salisbury, MD  REFERRING DIAG: M54.50,G89.29 (ICD-10-CM) - Chronic right-sided low back pain without sciatica  Rationale for Evaluation and Treatment: Rehabilitation  THERAPY DIAG:  Other low back pain  Cramp and spasm  Muscle weakness (generalized)  ONSET DATE: "for years" intermittently, but in the past 2 years has gotten worse  SUBJECTIVE:                                                                                                                                                                                           SUBJECTIVE STATEMENT: Patient reports that overall, she is feeling much better after dry  needling.  Patient reports that she is feeling 90% better since starting skilled PT.  PERTINENT HISTORY:  Osteoporosis  PAIN: 09/11/2023 Are you having pain? Yes: NPRS scale: 1/10 Pain location: right low back Pain description: aching Aggravating factors: standing for prolonged time, walking Relieving factors: heating pad and medication  PRECAUTIONS: None  RED FLAGS: None   WEIGHT BEARING RESTRICTIONS: No  FALLS:  Has patient fallen in last 6 months? 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  NEXT MD VISIT: Dr Clent Ridges on 09/24/2023  OBJECTIVE:  Note: Objective measures were completed at Evaluation unless otherwise noted.  DIAGNOSTIC FINDINGS:  Lumbar Radiograph on 07/08/2023: FINDINGS: Lumbar alignment within normal limits. Vertebral body heights are maintained. Moderate disc space narrowing at L5-S1. Mild facet degenerative changes of the lower lumbar spine  PATIENT SURVEYS:  Eval:  FOTO 54 (projected 63 by visit 11) 10/03/2023:  FOTO 83 (goal met)   COGNITION: Overall cognitive status: Within functional limits for tasks assessed     SENSATION: Patient denies  MUSCLE LENGTH: Hamstrings: tightness bilaterally (R>L)  POSTURE: rounded shoulders  PALPATION: Tenderness to palpation along right lumbar paraspinals with spasms noted  LUMBAR ROM:   Eval:  WFL with pain at end range  LOWER EXTREMITY ROM:     Eval: WFL  LOWER EXTREMITY MMT:    Eval:  Right hip strength of 4 to 4+/5, otherwise, WFL  LUMBAR SPECIAL TESTS:  Slump test: slight positive on right side, negative on left  side  FUNCTIONAL TESTS:   Eval: 5 times sit to stand: 7.93 sec Single leg stance:  right- 17.26 sec, left- >30 sec  GAIT: Distance walked: >200 ft Assistive device utilized: None Level of assistance: Complete Independence Comments: Pt reports that she walk for around 30 minutes before she starts getting pain  TODAY'S TREATMENT:                                                                                                                              DATE:  10/03/2023 Nustep level 5 x6 min with PT present to discuss status Seated hamstring stretch 2x20 sec bilat Seated piriformis stretch 2x20 sec bilat Seated 3 way blue pball rollout 3x10 sec each Seated on blue pball:  4 way pelvic tilts x15, CW/CCW pelvic clocks x15 each way, marching x20, LAQ x20 Standing marching on foam pad holding 5# kettlebell in unilateral hand 2x10 bilat Single leg stance on foam pad 2x20 sec bilat Tandem stance on foam pad x20 sec bilat Trigger Point Dry-Needling  Treatment instructions: Expect mild to moderate muscle soreness. S/S of pneumothorax if dry needled over a lung field, and to seek immediate medical attention should they occur. Patient verbalized understanding of these instructions and education. Patient Consent Given: Yes Education handout provided: Yes Muscles treated: bilateral lumbar multifidi Electrical stimulation performed: No Parameters: N/A Treatment response/outcome: Utilized skilled palpation to identify bony landmarks and trigger points.  Able to illicit twitch response and muscle elongation.  Soft tissue mobilization following to further promote tissue elongation.     09/27/2023 Nustep level  5 x6 min with PT present to discuss status Seated hamstring stretch 2x20 sec bilat Seated piriformis stretch 2x20 sec bilat Seated 3 way blue pball rollout 4x10 sec each Seated on blue pball:  4 way pelvic tilts x15, CW/CCW pelvic clocks x15 each way, marching x20, LAQ x20 Trigger Point  Dry-Needling  Treatment instructions: Expect mild to moderate muscle soreness. S/S of pneumothorax if dry needled over a lung field, and to seek immediate medical attention should they occur. Patient verbalized understanding of these instructions and education. Patient Consent Given: Yes Education handout provided: Yes Muscles treated: bilateral lumbar multifidi Electrical stimulation performed: No Parameters: N/A Treatment response/outcome: Utilized skilled palpation to identify bony landmarks and trigger points.  Able to illicit twitch response and muscle elongation.  Soft tissue mobilization following to further promote tissue elongation.   Manual Therapy:  soft tissue mobilization and silicone cupping to bilateral lumbar paraspinal area to promote tissue elongation and decreased pain.     09/11/2023 NuStep Level 5 6 mins- PT present to discuss status 3 way SB stretch x 8 each way Seated SB posterior tilt x 20  Supine TA activation + hip flexion x 10 each Supine TA activation + bent knee fallout x 10 each Hooklying hip abduction with black loop x 20 Sit to Stands 7 # KB 2 x10 Hip Matrix (abduction, extension ) 30# 2 x 10 bilateral  Pallof Press with red long loop x 20 each direction Farmer's Carries 8# Dbs x 3 laps around gym Manual Addaday: Rt lumbar paraspinals for improved circulation. PT present to monitor status.      PATIENT EDUCATION:  Education details: Issued HEP and education on osteoporosis Person educated: Patient Education method: Chief Technology Officer Education comprehension: verbalized understanding  HOME EXERCISE PROGRAM: Access Code: [email or web address] URL: [email or web address] Date: 10/03/2023 Prepared by: Clydie Braun Oaklee Esther  Exercises - Supine Lower Trunk Rotation  - 1 x daily - 7 x weekly - 1-2 sets - 10 reps - Seated Scapular Retraction  - 1 x daily - 7 x weekly - 2 sets - 10 reps - Seated Hamstring Stretch  - 1 x daily - 7 x weekly - 2 reps - 20  sec hold - Seated Piriformis Stretch with Trunk Bend  - 1 x daily - 7 x weekly - 2 reps - 20 sec hold - Seated Transversus Abdominis Bracing  - 1 x daily - 7 x weekly - 2 sets - 10 reps - Pelvic Tilt on Swiss Ball  - 1 x daily - 7 x weekly - 2 sets - 10 reps - Pelvic Circles on Swiss Ball  - 1 x daily - 7 x weekly - 2 sets - 10 reps - Single Leg Stance on Foam Pad  - 1 x daily - 7 x weekly - 2 reps - 30 sec hold - Tandem Stance on Foam Pad with Eyes Open  - 1 x daily - 7 x weekly - 2 reps - 30 sec hold  ASSESSMENT:  CLINICAL IMPRESSION: Ms Spizzirri presents to skilled PT reporting at least 90% improvement after adding in the dry needling last session.  Patient reports that she was able to work a shift of work without increased pain and feels like she can stretch her low back better now.  Patient requests another session of dry needling to assist further with the tight muscles before she returns to work tomorrow.  Patient continues with twitch response with dry needling and reports decreased pain and improved tissue mobility following manual  therapy/soft tissue mobilization.  Patient continues to require skilled PT to progress towards goal related activities.    OBJECTIVE IMPAIRMENTS: decreased balance, difficulty walking, decreased strength, increased muscle spasms, impaired flexibility, postural dysfunction, and pain.   ACTIVITY LIMITATIONS: lifting, bending, standing, squatting, and stairs  PARTICIPATION LIMITATIONS: cleaning, community activity, and occupation  PERSONAL FACTORS: Time since onset of injury/illness/exacerbation and 1 comorbidity: osteoporosis  are also affecting patient's functional outcome.   REHAB POTENTIAL: Good  CLINICAL DECISION MAKING: Stable/uncomplicated  EVALUATION COMPLEXITY: Low   GOALS: Goals reviewed with patient? Yes  SHORT TERM GOALS: Target date: 09/13/2023  Pt will be independent with initial HEP. Baseline: Goal status: Met  2.  Patient will  report at least a 30% improvement in symptoms since initial evaluation. Baseline:  Goal status: MET on 10/03/2023   LONG TERM GOALS: Target date: 10/25/2023  Patient will be independent with advanced HEP. Baseline:  Goal status: Ongoing  2.  Patient will improve right single leg stance to greater than 30 seconds demonstrate improved right hip stability. Baseline:  Goal status: Ongoing  3.  Patient will increase functional LE strength to [facility] to allow her to perform a forward T with good hip stability. Baseline:  Goal status: Ongoing  4.  Patient will increase FOTO to at least 63 to demonstrate improvements in functional mobility. Baseline: 54 Goal status: Met on 10/03/2023  5.  Patient will report being able to work a shift of work with pain no greater than 3/10. Baseline:  Goal status: Ongoing   PLAN:  PT FREQUENCY: 2x/week  PT DURATION: 8 weeks  PLANNED INTERVENTIONS: 97164- PT Re-evaluation, 97110-Therapeutic exercises, 97530- Therapeutic activity, [id number]- Neuromuscular re-education, 97535- Self Care, 40981- Manual therapy, [id number]- Gait training, [phone]- Canalith repositioning, [id number]- Aquatic Therapy, 97014- Electrical stimulation (unattended), [id number]- Electrical stimulation (manual), [id number]- Ultrasound, [id number]- Traction (mechanical), [id number]- Ionotophoresis 4mg /ml Dexamethasone, Patient/Family education, Balance training, Stair training, Taping, Dry Needling, Joint mobilization, Joint manipulation, Spinal manipulation, Spinal mobilization, Vestibular training, Cryotherapy, and Moist heat.  PLAN FOR NEXT SESSION: Assess and progress HEP as indicated, strengthening, flexibility, manual/dry needling as indicated     Reather Laurence, PT, DPT 10/03/23, 9:51 AM  [facility] [address], [address] [place], [place] [address] Phone # [phone] Fax [phone]   PHYSICAL THERAPY DISCHARGE SUMMARY  As of 11/07/2023, patient has not returned for further  visits and discharged at this time.  Patient agrees to discharge. Patient goals were partially met. Patient is being discharged due to not returning since the last visit.  Clydie Braun Chevelle Durr, PT, DPT 11/07/23, 7:47 AM

## 2023-10-08 ENCOUNTER — Other Ambulatory Visit: Payer: Self-pay | Admitting: Obstetrics and Gynecology

## 2023-10-08 DIAGNOSIS — R928 Other abnormal and inconclusive findings on diagnostic imaging of breast: Secondary | ICD-10-CM

## 2023-10-10 ENCOUNTER — Encounter: Payer: Medicare PPO | Admitting: Physical Therapy

## 2023-10-15 ENCOUNTER — Encounter: Payer: Medicare PPO | Admitting: Rehabilitative and Restorative Service Providers"

## 2023-10-17 ENCOUNTER — Encounter: Payer: Medicare PPO | Admitting: Rehabilitative and Restorative Service Providers"

## 2023-10-22 ENCOUNTER — Encounter: Payer: Medicare PPO | Admitting: Rehabilitative and Restorative Service Providers"

## 2023-10-25 ENCOUNTER — Encounter: Payer: Medicare PPO | Admitting: Rehabilitative and Restorative Service Providers"

## 2023-10-29 ENCOUNTER — Ambulatory Visit
Admission: RE | Admit: 2023-10-29 | Discharge: 2023-10-29 | Disposition: A | Discharge status: Home / Self Care | Payer: Medicare PPO | Source: Ambulatory Visit | Attending: Obstetrics and Gynecology | Admitting: Obstetrics and Gynecology

## 2023-10-29 ENCOUNTER — Encounter: Payer: Self-pay | Admitting: Family Medicine

## 2023-10-29 ENCOUNTER — Ambulatory Visit: Payer: Medicare PPO

## 2023-10-29 ENCOUNTER — Ambulatory Visit (INDEPENDENT_AMBULATORY_CARE_PROVIDER_SITE_OTHER): Payer: Medicare PPO | Admitting: Family Medicine

## 2023-10-29 VITALS — BP 110/76 | HR 63 | Temp 98.3°F | Ht 64.0 in | Wt 144.2 lb

## 2023-10-29 DIAGNOSIS — R739 Hyperglycemia, unspecified: Secondary | ICD-10-CM

## 2023-10-29 DIAGNOSIS — R92332 Mammographic heterogeneous density, left breast: Secondary | ICD-10-CM | POA: Diagnosis not present

## 2023-10-29 DIAGNOSIS — N6489 Other specified disorders of breast: Secondary | ICD-10-CM | POA: Diagnosis not present

## 2023-10-29 DIAGNOSIS — R7989 Other specified abnormal findings of blood chemistry: Secondary | ICD-10-CM | POA: Diagnosis not present

## 2023-10-29 DIAGNOSIS — E559 Vitamin D deficiency, unspecified: Secondary | ICD-10-CM | POA: Diagnosis not present

## 2023-10-29 DIAGNOSIS — M810 Age-related osteoporosis without current pathological fracture: Secondary | ICD-10-CM

## 2023-10-29 DIAGNOSIS — Z803 Family history of malignant neoplasm of breast: Secondary | ICD-10-CM | POA: Diagnosis not present

## 2023-10-29 DIAGNOSIS — R928 Other abnormal and inconclusive findings on diagnostic imaging of breast: Secondary | ICD-10-CM

## 2023-10-29 DIAGNOSIS — R946 Abnormal results of thyroid function studies: Secondary | ICD-10-CM

## 2023-10-29 DIAGNOSIS — F5101 Primary insomnia: Secondary | ICD-10-CM

## 2023-10-29 DIAGNOSIS — E785 Hyperlipidemia, unspecified: Secondary | ICD-10-CM

## 2023-10-29 DIAGNOSIS — G8929 Other chronic pain: Secondary | ICD-10-CM | POA: Diagnosis not present

## 2023-10-29 DIAGNOSIS — M545 Low back pain, unspecified: Secondary | ICD-10-CM | POA: Diagnosis not present

## 2023-10-29 LAB — CBC WITH DIFFERENTIAL/PLATELET
Basophils Absolute: 0.1 10*3/uL (ref 0.0–0.1)
Basophils Relative: 1.1 % (ref 0.0–3.0)
Eosinophils Absolute: 0.1 10*3/uL (ref 0.0–0.7)
Eosinophils Relative: 1.9 % (ref 0.0–5.0)
HCT: 43.8 % (ref 36.0–46.0)
Hemoglobin: 14.4 g/dL (ref 12.0–15.0)
Lymphocytes Relative: 22.8 % (ref 12.0–46.0)
Lymphs Abs: 1.2 10*3/uL (ref 0.7–4.0)
MCHC: 32.9 g/dL (ref 30.0–36.0)
MCV: 89.7 fL (ref 78.0–100.0)
Monocytes Absolute: 0.5 10*3/uL (ref 0.1–1.0)
Monocytes Relative: 10.1 % (ref 3.0–12.0)
Neutro Abs: 3.2 10*3/uL (ref 1.4–7.7)
Neutrophils Relative %: 64.1 % (ref 43.0–77.0)
Platelets: 206 10*3/uL (ref 150.0–400.0)
RBC: 4.88 Mil/uL (ref 3.87–5.11)
RDW: 12.7 % (ref 11.5–15.5)
WBC: 5.1 10*3/uL (ref 4.0–10.5)

## 2023-10-29 LAB — VITAMIN D 25 HYDROXY (VIT D DEFICIENCY, FRACTURES): VITD: 41.89 ng/mL (ref 30.00–100.00)

## 2023-10-29 LAB — LIPID PANEL
Cholesterol: 191 mg/dL (ref 0–200)
HDL: 58.5 mg/dL (ref >39.00)
LDL Cholesterol: 98 mg/dL (ref 0–99)
NonHDL: 132.33
Total CHOL/HDL Ratio: 3
Triglycerides: 173 mg/dL — ABNORMAL HIGH (ref 0.0–149.0)
VLDL: 34.6 mg/dL (ref 0.0–40.0)

## 2023-10-29 LAB — BASIC METABOLIC PANEL
BUN: 15 mg/dL (ref 6–23)
CO2: 29 meq/L (ref 19–32)
Calcium: 9.2 mg/dL (ref 8.4–10.5)
Chloride: 105 meq/L (ref 96–112)
Creatinine, Ser: 0.81 mg/dL (ref 0.40–1.20)
GFR: 74.03 mL/min (ref >60.00)
Glucose, Bld: 95 mg/dL (ref 70–99)
Potassium: 4.2 meq/L (ref 3.5–5.1)
Sodium: 141 meq/L (ref 135–145)

## 2023-10-29 LAB — HEPATIC FUNCTION PANEL
ALT: 16 U/L (ref 0–35)
AST: 19 U/L (ref 0–37)
Albumin: 4.5 g/dL (ref 3.5–5.2)
Alkaline Phosphatase: 60 U/L (ref 39–117)
Bilirubin, Direct: 0.1 mg/dL (ref 0.0–0.3)
Total Bilirubin: 0.6 mg/dL (ref 0.2–1.2)
Total Protein: 7.1 g/dL (ref 6.0–8.3)

## 2023-10-29 LAB — HEMOGLOBIN A1C: Hgb A1c MFr Bld: 5.7 % (ref 4.6–6.5)

## 2023-10-29 LAB — T4, FREE: Free T4: 0.93 ng/dL (ref 0.60–1.60)

## 2023-10-29 LAB — TSH: TSH: 3.99 u[IU]/mL (ref 0.35–5.50)

## 2023-10-29 LAB — T3, FREE: T3, Free: 3.3 pg/mL (ref 2.3–4.2)

## 2023-10-29 NOTE — Progress Notes (Signed)
Subjective:    Patient ID: Latasha Marshall, female    DOB: Aug 04, 1956, 68 y.o.   MRN: [id number]  HPI Here to follow up on issues. She feels well. She is treated for osteoporosis by her GYN. Her recent DEXA showed no changes. Her low back pain is stable. A few months ago she had some dry needling done to this area, and this was quite successful. She sleeps well with Temazepam.    Review of Systems  Constitutional: Negative.   HENT: Negative.    Eyes: Negative.   Respiratory: Negative.    Cardiovascular: Negative.   Gastrointestinal: Negative.   Genitourinary:  Negative for decreased urine volume, difficulty urinating, dyspareunia, dysuria, enuresis, flank pain, frequency, hematuria, pelvic pain and urgency.  Musculoskeletal: Negative.   Skin: Negative.   Neurological: Negative.  Negative for headaches.  Psychiatric/Behavioral: Negative.         Objective:   Physical Exam Constitutional:      General: She is not in acute distress.    Appearance: Normal appearance. She is well-developed.  HENT:     Head: Normocephalic and atraumatic.     Right Ear: External ear normal.     Left Ear: External ear normal.     Nose: Nose normal.     Mouth/Throat:     Pharynx: No oropharyngeal exudate.  Eyes:     General: No scleral icterus.    Conjunctiva/sclera: Conjunctivae normal.     Pupils: Pupils are equal, round, and reactive to light.  Neck:     Thyroid: No thyromegaly.     Vascular: No JVD.  Cardiovascular:     Rate and Rhythm: Normal rate and regular rhythm.     Pulses: Normal pulses.     Heart sounds: Normal heart sounds. No murmur heard.    No friction rub. No gallop.  Pulmonary:     Effort: Pulmonary effort is normal. No respiratory distress.     Breath sounds: Normal breath sounds. No wheezing or rales.  Chest:     Chest wall: No tenderness.  Abdominal:     General: Bowel sounds are normal. There is no distension.     Palpations: Abdomen is soft. There is no mass.      Tenderness: There is no abdominal tenderness. There is no guarding or rebound.  Musculoskeletal:        General: No tenderness. Normal range of motion.     Cervical back: Normal range of motion and neck supple.  Lymphadenopathy:     Cervical: No cervical adenopathy.  Skin:    General: Skin is warm and dry.     Findings: No erythema or rash.  Neurological:     General: No focal deficit present.     Mental Status: She is alert and oriented to person, place, and time.     Cranial Nerves: No cranial nerve deficit.     Motor: No abnormal muscle tone.     Coordination: Coordination normal.     Deep Tendon Reflexes: Reflexes are normal and symmetric. Reflexes normal.  Psychiatric:        Mood and Affect: Mood normal.        Behavior: Behavior normal.        Thought Content: Thought content normal.        Judgment: Judgment normal.           Assessment & Plan:  Her low back pain and insomnia are stable. Her osteoporosis is stable. We will get fasting labs  to check lipids, vitamin D level, etc. We spent a total of (33   ) minutes reviewing records and discussing these issues.  Gershon Crane, MD

## 2023-10-30 ENCOUNTER — Encounter: Payer: Self-pay | Admitting: *Deleted

## 2024-03-05 ENCOUNTER — Encounter: Admitting: Family Medicine

## 2024-03-18 ENCOUNTER — Other Ambulatory Visit: Payer: Self-pay | Admitting: Obstetrics and Gynecology

## 2024-03-18 DIAGNOSIS — Z803 Family history of malignant neoplasm of breast: Secondary | ICD-10-CM

## 2024-04-02 ENCOUNTER — Other Ambulatory Visit: Payer: Self-pay | Admitting: Family Medicine

## 2024-04-24 ENCOUNTER — Other Ambulatory Visit

## 2024-05-10 ENCOUNTER — Ambulatory Visit
Admission: RE | Admit: 2024-05-10 | Discharge: 2024-05-10 | Disposition: A | Discharge status: Home / Self Care | Source: Ambulatory Visit | Attending: Obstetrics and Gynecology | Admitting: Obstetrics and Gynecology

## 2024-05-10 DIAGNOSIS — Z803 Family history of malignant neoplasm of breast: Secondary | ICD-10-CM

## 2024-05-10 DIAGNOSIS — N6489 Other specified disorders of breast: Secondary | ICD-10-CM | POA: Diagnosis not present

## 2024-05-10 MED ORDER — GADOPICLENOL 0.5 MMOL/ML IV SOLN
6.0000 mL | Freq: Once | INTRAVENOUS | Status: AC | PRN
  Administered 2024-05-10: 6 mL via INTRAVENOUS

## 2024-08-21 ENCOUNTER — Encounter: Payer: Self-pay | Admitting: Family Medicine

## 2024-08-21 ENCOUNTER — Ambulatory Visit: Admitting: Family Medicine

## 2024-08-21 VITALS — BP 110/70 | HR 65 | Temp 98.9°F | Wt 148.0 lb

## 2024-08-21 DIAGNOSIS — G8929 Other chronic pain: Secondary | ICD-10-CM | POA: Diagnosis not present

## 2024-08-21 DIAGNOSIS — M545 Low back pain, unspecified: Secondary | ICD-10-CM

## 2024-08-21 MED ORDER — CELECOXIB 200 MG PO CAPS: 200.0000 mg | ORAL_CAPSULE | Freq: Two times a day (BID) | ORAL | 5 refills | Status: AC | PRN

## 2024-08-21 NOTE — Progress Notes (Signed)
   Subjective:    Patient ID: Latasha Marshall, female    DOB: 09/24/1956, 68 y.o.   MRN: [id number]  HPI Here for worsening right sided low back pain. This started about 20 years ago, and it comes and goes. She has been taking Meloxicam , but this no longer helps. No radiation to the legs. She has tried PT, but this did not help much. However she gets good pain relief with dry needling, but this does not last very long.    Review of Systems  Constitutional: Negative.   Respiratory: Negative.    Cardiovascular: Negative.   Musculoskeletal:  Positive for back pain.       Objective:   Physical Exam Constitutional:      Appearance: Normal appearance.  Cardiovascular:     Rate and Rhythm: Normal rate and regular rhythm.     Pulses: Normal pulses.     Heart sounds: Normal heart sounds.  Pulmonary:     Effort: Pulmonary effort is normal.     Breath sounds: Normal breath sounds.  Musculoskeletal:     Comments: She is mildly tender over the right SI area. The spine has full ROM   Neurological:     Mental Status: She is alert.           Assessment & Plan:  Right low back pain, possibly sacroiliitis. Refer to Orthopedics. Stop Meloxicam  and try Celebrex 200 mg BID as needed.  Garnette Olmsted, MD

## 2024-09-29 ENCOUNTER — Ambulatory Visit: Admitting: Family Medicine

## 2024-09-29 ENCOUNTER — Encounter: Payer: Self-pay | Admitting: Family Medicine

## 2024-10-15 ENCOUNTER — Other Ambulatory Visit: Payer: Self-pay | Admitting: Family Medicine

## 2024-10-20 ENCOUNTER — Encounter: Payer: Self-pay | Admitting: Family Medicine

## 2024-11-04 ENCOUNTER — Other Ambulatory Visit: Payer: Self-pay | Admitting: Obstetrics and Gynecology

## 2024-11-04 DIAGNOSIS — R928 Other abnormal and inconclusive findings on diagnostic imaging of breast: Secondary | ICD-10-CM

## 2024-11-11 ENCOUNTER — Ambulatory Visit
Admission: RE | Admit: 2024-11-11 | Discharge: 2024-11-11 | Disposition: A | Source: Ambulatory Visit | Attending: Obstetrics and Gynecology | Admitting: Obstetrics and Gynecology

## 2024-11-11 ENCOUNTER — Ambulatory Visit

## 2024-11-11 DIAGNOSIS — R928 Other abnormal and inconclusive findings on diagnostic imaging of breast: Secondary | ICD-10-CM
